# Patient Record
Sex: Female | Born: 1960 | Race: White | Hispanic: No | Marital: Married | Smoking: Never smoker
Health system: Southern US, Community
[De-identification: ages and names within clinical notes are randomized; demographics above are authoritative.]

## PROBLEM LIST (undated history)

## (undated) DIAGNOSIS — N200 Calculus of kidney: Secondary | ICD-10-CM

## (undated) DIAGNOSIS — M7710 Lateral epicondylitis, unspecified elbow: Secondary | ICD-10-CM

## (undated) DIAGNOSIS — N83209 Unspecified ovarian cyst, unspecified side: Secondary | ICD-10-CM

## (undated) HISTORY — DX: Calculus of kidney: N20.0

## (undated) HISTORY — DX: Unspecified ovarian cyst, unspecified side: N83.209

## (undated) HISTORY — DX: Lateral epicondylitis, unspecified elbow: M77.10

## (undated) HISTORY — PX: ROTATOR CUFF REPAIR: SHX139

## (undated) HISTORY — PX: TONSILLECTOMY AND ADENOIDECTOMY: SHX28

---

## 2010-01-25 ENCOUNTER — Encounter: Payer: Self-pay | Admitting: Family Medicine

## 2010-04-17 ENCOUNTER — Ambulatory Visit: Payer: Self-pay | Admitting: Family Medicine

## 2010-04-17 ENCOUNTER — Encounter: Payer: Self-pay | Admitting: Family Medicine

## 2010-04-17 DIAGNOSIS — R634 Abnormal weight loss: Secondary | ICD-10-CM | POA: Insufficient documentation

## 2010-04-17 DIAGNOSIS — R5383 Other fatigue: Secondary | ICD-10-CM

## 2010-04-17 DIAGNOSIS — R5381 Other malaise: Secondary | ICD-10-CM | POA: Insufficient documentation

## 2010-04-19 LAB — CONVERTED CEMR LAB
ALT: 10 units/L (ref 0–35)
AST: 14 units/L (ref 0–37)
Alkaline Phosphatase: 63 units/L (ref 39–117)
BUN: 10 mg/dL (ref 6–23)
Basophils Absolute: 0.1 10*3/uL (ref 0.0–0.1)
Basophils Relative: 1 % (ref 0–1)
Calcium: 9.3 mg/dL (ref 8.4–10.5)
Chloride: 105 meq/L (ref 96–112)
Creatinine, Ser: 0.68 mg/dL (ref 0.40–1.20)
Eosinophils Relative: 1 % (ref 0–5)
Folate: 19.5 ng/mL
HCT: 35.1 % — ABNORMAL LOW (ref 36.0–46.0)
Iron: 58 ug/dL (ref 42–145)
Lymphocytes Relative: 26 % (ref 12–46)
MCHC: 31.1 g/dL (ref 30.0–36.0)
Monocytes Absolute: 0.6 10*3/uL (ref 0.1–1.0)
Platelets: 375 10*3/uL (ref 150–400)
RDW: 15.9 % — ABNORMAL HIGH (ref 11.5–15.5)
Saturation Ratios: 14 % — ABNORMAL LOW (ref 20–55)
Sed Rate: 1 mm/hr (ref 0–22)
TIBC: 405 ug/dL (ref 250–470)
Total Bilirubin: 0.5 mg/dL (ref 0.3–1.2)
Vit D, 25-Hydroxy: 29 ng/mL — ABNORMAL LOW (ref 30–89)

## 2010-05-21 ENCOUNTER — Encounter: Payer: Self-pay | Admitting: Family Medicine

## 2010-05-21 LAB — HM COLONOSCOPY

## 2010-05-23 ENCOUNTER — Telehealth: Payer: Self-pay | Admitting: Family Medicine

## 2010-05-27 ENCOUNTER — Encounter: Payer: Self-pay | Admitting: Family Medicine

## 2010-05-27 ENCOUNTER — Ambulatory Visit
Admission: RE | Admit: 2010-05-27 | Discharge: 2010-05-27 | Payer: Self-pay | Source: Home / Self Care | Attending: Family Medicine | Admitting: Family Medicine

## 2010-05-27 DIAGNOSIS — D510 Vitamin B12 deficiency anemia due to intrinsic factor deficiency: Secondary | ICD-10-CM | POA: Insufficient documentation

## 2010-05-27 DIAGNOSIS — D509 Iron deficiency anemia, unspecified: Secondary | ICD-10-CM | POA: Insufficient documentation

## 2010-05-27 DIAGNOSIS — E559 Vitamin D deficiency, unspecified: Secondary | ICD-10-CM | POA: Insufficient documentation

## 2010-05-30 NOTE — Progress Notes (Signed)
Summary: Patient with a question   Phone Note Call from Patient   Caller: Patient Call For: Azriel Dancy Summary of Call: Patient called and she seen Dr. Linford Arnold for the first time in December and is not sure when she needs to f/u with Dr. Linford Arnold again. She would like a phone call back to let her know. Please advise. Thanks, Michaelle Copas  May 23, 2010 9:31 AM  Initial call taken by: Michaelle Copas,  May 23, 2010 9:31 AM  Follow-up for Phone Call        [facility] up at the end of this month.  Follow-up by: Nani Gasser MD,  May 23, 2010 9:40 AM  Additional Follow-up for Phone Call Additional follow up Details #1::        I called patient and scheduled her for 05-27-10 at 2:30.Michaelle Copas  May 24, 2010 9:02 AM  Additional Follow-up by: Michaelle Copas,  May 24, 2010 9:02 AM

## 2010-05-30 NOTE — Assessment & Plan Note (Signed)
Summary: SEG:BTDVVO Loss, Fatigue   Vital Signs:  Patient profile:   50 year old female Height:      66.25 inches Weight:      119 pounds BMI:     19.13 Pulse rate:   102 / minute BP sitting:   111 / 68  (right arm) Cuff size:   regular  Vitals Entered By: Avon Gully CMA, Duncan Dull) (April 17, 2010 9:27 AM) CC: NP est care-fatigue and wt loss   CC:  NP est care-fatigue and wt loss.  History of Present Illness: NP est care-fatigue and wt loss.  Since October has lost 10 lbs. Hasn't been trying too. Has actually slacked of ont he exercise becaue thought it was causing some of the weight loss.  NOrmally weighs in the low 120s.  Has really heavy periods and seeing Gynel[facility] _- Dr. Ruby Cola at [facility]. No nervous, sweaty,anxious. Occ notices heart flutter in the past month.  Can last minute or two. No CP.  2 cousins have thyroid issues. No hair or skin changes. Nails are more brittle.Eats 3 meals a day. No HA, no nubmness or tingling. Her mammmo is uptodate.  Her pap is up to date. Never had a colonoscopy.  Lots more fatigued.   Habits & Providers  Alcohol-Tobacco-Diet     Alcohol drinks/day: <1     Tobacco Status: never  Exercise-Depression-Behavior     Does Patient Exercise: no     STD Risk: never     Drug Use: no     Seat Belt Use: always  Current Medications (verified): 1)  None  Allergies (verified): No Known Drug Allergies  Comments:  Nurse/Medical Assistant: The patient's medications and allergies were reviewed with the patient and were updated in the Medication and Allergy Lists. 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  Married to [employer] with 2 adult kids.  Never  Smoked Alcohol use-yes Drug use-no Regular exercise-no 1 caffeinated drink per day.  Smoking Status:  never Does Patient Exercise:  no STD Risk:  never Drug Use:  no Seat Belt Use:  always  Review of Systems       No fever/sweats/weakness, + unexplained weight loss/gain.  No vison changes.  No difficulty hearing/ringing in ears, hay fever/allergies.  No chest pain/discomfort, + palpitations.  No Br lump/nipple discharge.  No cough/wheeze.  No blood in BM, nausea/vomiting/diarrhea.  No nighttime urination, leaking urine, unusual vaginal bleeding, discharge (penis or vagina).  No muscle/joint pain. No rash, change in mole.  No HA, memory loss.  No anxiety, sleep d/o, depression.  No easy bruising/bleeding, unexplained lump   Physical Exam  General:  Well-developed,well-nourished,in no acute distress; alert,appropriate and cooperative throughout examination Head:  Normocephalic and atraumatic without obvious abnormalities. No apparent alopecia or balding. Eyes:  No corneal or conjunctival inflammation noted. EOMI. Perrla.  Ears:  External ear exam shows no significant lesions or deformities.  Otoscopic examination reveals clear canals, tympanic membranes are intact bilaterally without bulging, retraction, inflammation or discharge. Hearing is grossly normal bilaterally. Nose:  External nasal examination shows no deformity or inflammation.  Mouth:  Oral mucosa and oropharynx without lesions or exudates.  Teeth in good repair. Neck:  No deformities, masses, or tenderness noted. Lungs:  Normal  respiratory effort, chest expands symmetrically. Lungs are clear to auscultation, no crackles or wheezes. Heart:  Normal rate and regular rhythm. S1 and S2 normal without gallop, murmur, click, rub or other extra sounds. Abdomen:  Bowel sounds positive,abdomen soft and non-tender without masses, organomegaly or hernias noted. Msk:  No deformity or scoliosis noted of thoracic or lumbar spine.   Pulses:   RAdial and dorsla pedal 2+ bilat  Extremities:  No LE edema.  Neurologic:  alert & oriented X3, cranial nerves II-XII intact, gait normal, and DTRs symmetrical and normal.   Skin:  Intact without suspicious lesions or rashes Cervical Nodes:  No lymphadenopathy noted Psych:  Cognition and judgment appear intact. Alert and cooperative with normal attention span and concentration. No apparent delusions, illusions, hallucinations   Impression & Recommendations:  Problem # 1:  WEIGHT LOSS (ICD-783.21) Discussed could be hyperthyroid. May be physiologic and my not really have a cause. Will rule out elevation in liver or kidney funciton.  Also her pap and mammo are up tod ate. Need to get a colonoscpy . Rec age appropirate cancer screening. Will also check a CBC.  Orders: T-Comprehensive Metabolic Panel [phone]) T-TSH [phone]) T-CBC w/Diff [phone]) T-Vitamin D (25-Hydroxy) [phone]) T-Vitamin B12 [phone]) T-Sed Rate (Automated) [phone]) T-Iron [phone]) T-Iron Binding Capacity (TIBC) ([id number]) T-Folate (95188)  Problem # 2:  FATIGUE (ICD-780.79) Discussed may be anemia, which could also be causing th palpitations. Will rule out thyroidd/o as well. Also check for deficiencies .   Orders: T-Comprehensive Metabolic Panel [phone]) T-TSH [phone]) T-CBC w/Diff [phone]) T-Vitamin D (25-Hydroxy) [phone]) T-Vitamin B12 [phone]) T-Sed Rate (Automated) [phone])  Other Orders: Gastroenterology Referral (GI)  Patient Instructions: 1)  We will call you with your lab results  2)  We will also refer you for a colonoscopy as it is recommend to screen for colon cancer at age 6.    Orders Added: 1)  T-Comprehensive Metabolic Panel [[id number]] 2)  T-TSH [[id number]] 3)  T-CBC w/Diff [[id number]] 4)  T-Vitamin D (25-Hydroxy) [67893-81017] 5)  T-Vitamin B12 [82607-23330] 6)  T-Sed Rate (Automated) [51025-85277] 7)  T-Iron  [[id number]] 8)  T-Iron Binding Capacity (TIBC) [[address]] 9)  T-Folate [23340] 10)  Gastroenterology Referral [GI] 11)  New Patient Level IV [76195]

## 2010-06-05 NOTE — Miscellaneous (Signed)
Summary: Colonosocpy  Clinical Lists Changes  Observations: Added new observation of HEMOCULTDUE: Not Indicated (05/27/2010 9:02) Added new observation of FLEXSIGDUE: Not Indicated (05/27/2010 9:02) Added new observation of COLONNXTDUE: 05/21/2020 (05/27/2010 9:02) Added new observation of CREATNXTDUE: 04/18/2011 (05/27/2010 9:02) Added new observation of POTASSIUMDUE: 04/18/2011 (05/27/2010 9:02) Added new observation of LST COLON DT: 05/21/2010 (05/21/2010 9:03) Added new observation of COLONOSCOPY: normal (05/21/2010 9:03)     Flex Sig Next Due:  Not Indicated Colonoscopy Result Date:  05/21/2010 Colonoscopy Result:  normal Colonoscopy Next Due:  10 yr Hemoccult Next Due:  Not Indicated Performed t [facility].

## 2010-06-05 NOTE — Assessment & Plan Note (Signed)
Summary: F/U fatigue   Vital Signs:  Patient profile:   50 year old female Height:      66.25 inches Weight:      120 pounds Pulse rate:   83 / minute BP sitting:   97 / 59  (right arm) Cuff size:   regular  Vitals Entered By: Avon Gully CMA, Duncan Dull) (May 27, 2010 2:45 PM) CC: f/u low iron   CC:  f/u low iron.  History of Present Illness: Completed the vitaming D adn now taking the 2000iu once a day.  Sasys she dos feel some better. She is also alot less stressed than last time. Her daughter is off sutdying abroad adn the HOlidays are over.She has not had any more palpititaions. She has also stopped having periods.   Not on the iron or the Vitaming B 12 but has questions. She doesn't want to do injections. She is not takinbg either one of these supplements.    Current Medications (verified): 1)  Vitamin D 2000iu Capsules .Marland KitchenMarland Kitchen. 1 Capsule By Mouth Once A Wk.  Allergies (verified): No Known Drug Allergies  Comments:  Nurse/Medical Assistant: The patient's medications and allergies were reviewed with the patient and were updated in the Medication and Allergy Lists. Avon Gully CMA, Duncan Dull) (May 27, 2010 2:47 PM)  Physical Exam  General:  Well-developed,well-nourished,in no acute distress; alert,appropriate and cooperative throughout examination Lungs:  Normal respiratory effort, chest expands symmetrically. Lungs are clear to auscultation, no crackles or wheezes. Heart:  Normal rate and regular rhythm. S1 and S2 normal without gallop, murmur, click, rub or other extra sounds.   Impression & Recommendations:  Problem # 1:  ANEMIA, IRON DEFICIENCY (ICD-280.9) Starte OTC iron and f/u in 3 months. Recommend dietary sources of iron such as liver.  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 If not responding then may nto be absorbing it throught her gut.   Orders: T-Vitamin B12 ([id number])  Problem # 3:  VITAMIN D DEFICIENCY (ICD-268.9) Will recheck herlevels at her f/u in 3 months.  Orders: T-Vitamin B12 ([id number])  Problem # 4:  FATIGUE (ICD-780.79) Explained to her this should get a lot better on teh iron and B12 but I also think her improved pyscho-social facgtors have helped this as well.   Complete Medication List: 1)  Vitamin D 2000iu Capsules  .Marland Kitchen.. 1 capsule by mouth once a wk.  Other Orders: T-Vitamin D (25-Hydroxy) [phone])  Patient Instructions: 1)  Will recheck your Vitamin B12 and iron levels.  2)  Start one a day B12 and iron.    Orders Added: 1)  T-Iron [[id number]] 2)  T-Iron Binding Capacity (TIBC) [[address]] 3)  T-Vitamin D (25-Hydroxy) [60109-32355] 4)  T-Vitamin B12 [82607-23330] 5)  Est. Patient Level III [73220]

## 2010-06-13 NOTE — Procedures (Signed)
Summary: Colonoscopy Report/[facility]  Colonoscopy Report/[facility]   Imported By: Maryln Gottron 06/06/2010 14:16:22  _____________________________________________________________________  External Attachment:    Type:   Image     Comment:   External Document

## 2010-06-13 NOTE — Letter (Signed)
Summary: Records from [facility] 2010 - 2011  Records from [facility] 2010 - 2011   Imported By: Maryln Gottron 06/05/2010 12:19:16  _____________________________________________________________________  External Attachment:    Type:   Image     Comment:   External Document

## 2010-09-05 ENCOUNTER — Ambulatory Visit (INDEPENDENT_AMBULATORY_CARE_PROVIDER_SITE_OTHER): Payer: Commercial Indemnity | Admitting: Family Medicine

## 2010-09-05 DIAGNOSIS — J329 Chronic sinusitis, unspecified: Secondary | ICD-10-CM

## 2010-09-05 DIAGNOSIS — J40 Bronchitis, not specified as acute or chronic: Secondary | ICD-10-CM

## 2010-09-05 MED ORDER — AMOXICILLIN 875 MG PO TABS: 875.0000 mg | ORAL_TABLET | Freq: Two times a day (BID) | ORAL | Status: AC

## 2010-09-05 NOTE — Progress Notes (Signed)
  Subjective:    Patient ID: Laurie Cooper, female    DOB: Jan 21, 1961, 50 y.o.   MRN: [id number]  HPI Cough, fever for 2 days.  Husband with with sxs for 2 weeks.  Fever was 102 last night. No SOB. Cough is more dry today. No N/V/D.  No CP or SOB. No meds for sx.  Feels fatigued, achey.  Ears feel full, no ST. Teeth are aching. No seasonal allergies. Cough is not keeping her awake at night.  No worsening or alleviating sxs.    Review of Systems     Objective:   Physical Exam  Constitutional: She is oriented to person, place, and time. She appears well-developed and well-nourished.  HENT:  Head: Normocephalic and atraumatic.  Right Ear: External ear normal.  Nose: Nose normal.  Mouth/Throat: Oropharynx is clear and moist.  Eyes: Conjunctivae are normal. Pupils are equal, round, and reactive to light.  Neck: Neck supple. No thyromegaly present.  Cardiovascular: Normal rate, regular rhythm and normal heart sounds.   Pulmonary/Chest: Effort normal and breath sounds normal.  Lymphadenopathy:    She has no cervical adenopathy.  Neurological: She is alert and oriented to person, place, and time.  Skin: Skin is warm and dry.          Assessment & Plan:  Likely viral sinusitis/bronchtis - Likely viral. If fever higher than 102 to if fever persistant for more than 4 days then can fill the rx for the ABX. Symptomatic care.  Cal if not better in 14 days.

## 2010-09-06 ENCOUNTER — Ambulatory Visit: Payer: Self-pay | Admitting: Family Medicine

## 2010-09-17 ENCOUNTER — Telehealth: Payer: Self-pay | Admitting: Family Medicine

## 2010-09-26 NOTE — Telephone Encounter (Signed)
Closed. Alla Sloma, LPN /Triage  

## 2010-11-11 ENCOUNTER — Ambulatory Visit (INDEPENDENT_AMBULATORY_CARE_PROVIDER_SITE_OTHER): Payer: Commercial Indemnity | Admitting: Family Medicine

## 2010-11-11 DIAGNOSIS — Z23 Encounter for immunization: Secondary | ICD-10-CM

## 2010-11-11 MED ORDER — TETANUS-DIPHTH-ACELL PERTUSSIS 5-2-15.5 LF-MCG/0.5 IM SUSP: 0.5000 mL | Freq: Once | INTRAMUSCULAR | Status: DC

## 2010-11-11 NOTE — Patient Instructions (Signed)
Pt  Instructed that redness, swelling, or hardness at injection site is common.  Cold compress first 24 hours and then go to heat.  Tylenol Q 4-6 hours may be used as needed for discomfort.  Updated VIS sheet given.  Pt never had problems with tetanus vaccine in the past.  Well today without any acute symptoms or complaints. Jarvis Newcomer, LPN [facility]

## 2010-11-11 NOTE — Progress Notes (Signed)
  Subjective:    Patient ID: Laurie Cooper, female    DOB: 03-Jul-1960, 50 y.o.   MRN: [id number]  HPI  Tdap given.   Review of Systems     Objective:   Physical Exam        Assessment & Plan:

## 2012-03-16 ENCOUNTER — Ambulatory Visit (INDEPENDENT_AMBULATORY_CARE_PROVIDER_SITE_OTHER): Payer: Commercial Indemnity | Admitting: Family Medicine

## 2012-03-16 ENCOUNTER — Encounter: Payer: Self-pay | Admitting: Family Medicine

## 2012-03-16 VITALS — BP 108/67 | HR 70 | Temp 98.4°F | Resp 14 | Wt 130.0 lb

## 2012-03-16 DIAGNOSIS — R682 Dry mouth, unspecified: Secondary | ICD-10-CM

## 2012-03-16 DIAGNOSIS — H04129 Dry eye syndrome of unspecified lacrimal gland: Secondary | ICD-10-CM

## 2012-03-16 DIAGNOSIS — R1032 Left lower quadrant pain: Secondary | ICD-10-CM

## 2012-03-16 DIAGNOSIS — R1031 Right lower quadrant pain: Secondary | ICD-10-CM

## 2012-03-16 DIAGNOSIS — H04123 Dry eye syndrome of bilateral lacrimal glands: Secondary | ICD-10-CM

## 2012-03-16 DIAGNOSIS — K117 Disturbances of salivary secretion: Secondary | ICD-10-CM

## 2012-03-16 DIAGNOSIS — R300 Dysuria: Secondary | ICD-10-CM

## 2012-03-16 LAB — POCT URINALYSIS DIPSTICK
Bilirubin, UA: NEGATIVE
Glucose, UA: NEGATIVE
Leukocytes, UA: NEGATIVE
Nitrite, UA: NEGATIVE
Urobilinogen, UA: 0.2

## 2012-03-16 NOTE — Progress Notes (Signed)
CC: Laurie Cooper is a 51 y.o. female is here for Pelvic Pain and Back Pain   Subjective: HPI:  Patient complains of low pelvic pain is been present ever since Sunday. Getting worse on a daily basis. Present 24 hours a day and has even awoken her at night at times. Pain is described as a burning discomfort. Is not influenced by urinating nor bowel habits. Pain is not radiating. Pain is somewhat worse when driving on rough roads, improves with lying down flat. Nothing else makes better or worse. Pain is 5/10 at its worse on the severity scale. She has never had this discomfort before.  No interventions as of yet. She denies fevers, chills, nausea, vomiting, night sweats, abdominal pain, flank pain, diarrhea, constipation, urinary frequency, change in the odor color or consistency of her urine, nor abnormal vaginal discharge. Her periods are becoming more unpredictable over the past years, most recent period First a this month and lasted less than a day.  She continues to have regular bowel movements, denies constipation. She has a history of cysts on her ovaries but no history of ruptured cyst  Reports dry mouth the last 4 months seems to be more noticeable. Finds that she's always having to have water or liquids around. Has presented in conjunction with dry eyes and inability to wear contacts for more than 4 hours do to eye irritation. Denies family history of rheumatologic disease. Denies history of ulcerations in the mouth or on the eyes.    Review Of Systems Outlined In HPI  Past Medical History  Diagnosis Date  . Kidney stone      Family History  Problem Relation Age of Onset  . Hodgkin's lymphoma Mother   . Diabetes Maternal Grandmother   . Diabetes Paternal Grandmother      History  Substance Use Topics  . Smoking status: Never Smoker   . Smokeless tobacco: Not on file  . Alcohol Use: Not on file     Objective: Filed Vitals:   03/16/12 1553  BP: 108/67  Pulse: 70  Temp:  98.4 F (36.9 C)  Resp: 14    General: Alert and Oriented, No Acute Distress HEENT: Pupils equal, round, reactive to light. Conjunctivae clear.   Moist mucous membranes, pharynx without inflammation nor lesions.  Neck supple without palpable lymphadenopathy nor abnormal masses. Cardiac: Regular rate and rhythm.  Abdomen: Normal bowel sounds, no palpable masses, negative Murphy's sign, right and left lower quadrant discomfort with deep palpation without rebound. Pain reproduced is that which she presents for Extremities: No peripheral edema.  Strong peripheral pulses.  Mental Status: No depression, anxiety, nor agitation. Skin: Warm and dry.  Assessment & Plan: Laurie Cooper was seen today for pelvic pain and back pain.  Diagnoses and associated orders for this visit:  Dysuria - POCT urinalysis dipstick - Urine culture  Right lower quadrant pain - Sed Rate (ESR) - C-reactive protein - CBC with Differential  Left lower quadrant pain - Sed Rate (ESR) - C-reactive protein - CBC with Differential  Dry mouth - Antinuclear Antib (ANA)  Dry eyes - Antinuclear Antib (ANA)    Urinalysis not suggestive of UTI nor nephrolithiasis, will follow culture. Inflammatory labs above to rule out diverticulitis or active infection. If these are unremarkable we will consider pelvic ultrasound, CT scan otherwise. Rule out possibility of sogrens syndrome with ANA. I will call her with results tomorrow for further management she declines pain medication citing that her pain is not"that bad".Signs and symptoms requring  emergent/urgent reevaluation were discussed with the patient.  Return for will call you with results tomorrow.

## 2012-03-17 LAB — CBC WITH DIFFERENTIAL/PLATELET
Basophils Relative: 1 % (ref 0–1)
Eosinophils Absolute: 0.1 10*3/uL (ref 0.0–0.7)
Eosinophils Relative: 1 % (ref 0–5)
Lymphs Abs: 2.5 10*3/uL (ref 0.7–4.0)
MCH: 30.6 pg (ref 26.0–34.0)
MCHC: 33.9 g/dL (ref 30.0–36.0)
MCV: 90.2 fL (ref 78.0–100.0)
Monocytes Relative: 11 % (ref 3–12)
Neutrophils Relative %: 61 % (ref 43–77)
Platelets: 311 10*3/uL (ref 150–400)
RBC: 4.58 MIL/uL (ref 3.87–5.11)

## 2012-03-17 LAB — ANA: Anti Nuclear Antibody(ANA): NEGATIVE

## 2012-03-17 LAB — SEDIMENTATION RATE: Sed Rate: 1 mm/hr (ref 0–22)

## 2012-03-19 LAB — URINE CULTURE

## 2012-03-22 ENCOUNTER — Telehealth: Payer: Self-pay | Admitting: *Deleted

## 2012-03-22 DIAGNOSIS — R102 Pelvic and perineal pain: Secondary | ICD-10-CM

## 2012-03-22 NOTE — Telephone Encounter (Signed)
Pt called and states the abd pain has come back and would like to proceed with the u/s

## 2012-03-23 ENCOUNTER — Ambulatory Visit (HOSPITAL_BASED_OUTPATIENT_CLINIC_OR_DEPARTMENT_OTHER)
Admission: RE | Admit: 2012-03-23 | Discharge: 2012-03-23 | Disposition: A | Discharge status: Home / Self Care | Payer: Commercial Indemnity | Source: Ambulatory Visit | Attending: Family Medicine | Admitting: Family Medicine

## 2012-03-23 ENCOUNTER — Other Ambulatory Visit: Payer: Self-pay | Admitting: Family Medicine

## 2012-03-23 DIAGNOSIS — R9389 Abnormal findings on diagnostic imaging of other specified body structures: Secondary | ICD-10-CM | POA: Insufficient documentation

## 2012-03-23 DIAGNOSIS — R102 Pelvic and perineal pain: Secondary | ICD-10-CM

## 2012-03-23 DIAGNOSIS — N949 Unspecified condition associated with female genital organs and menstrual cycle: Secondary | ICD-10-CM | POA: Insufficient documentation

## 2012-03-23 NOTE — Telephone Encounter (Signed)
Pt.notified

## 2012-03-23 NOTE — Telephone Encounter (Signed)
Sue Lush, Order has been placed STAT, will you please let her know that I'm hopefull this will be scheduled before the holiday weekend at the [facility]. Thank you.

## 2012-03-26 ENCOUNTER — Encounter (HOSPITAL_BASED_OUTPATIENT_CLINIC_OR_DEPARTMENT_OTHER): Payer: Self-pay | Admitting: *Deleted

## 2012-03-26 ENCOUNTER — Emergency Department (HOSPITAL_BASED_OUTPATIENT_CLINIC_OR_DEPARTMENT_OTHER)
Admission: EM | Admit: 2012-03-26 | Discharge: 2012-03-26 | Disposition: A | Discharge status: Home / Self Care | Payer: Commercial Indemnity | Attending: Emergency Medicine | Admitting: Emergency Medicine

## 2012-03-26 DIAGNOSIS — Z8742 Personal history of other diseases of the female genital tract: Secondary | ICD-10-CM | POA: Insufficient documentation

## 2012-03-26 DIAGNOSIS — Z87442 Personal history of urinary calculi: Secondary | ICD-10-CM | POA: Insufficient documentation

## 2012-03-26 DIAGNOSIS — Z3202 Encounter for pregnancy test, result negative: Secondary | ICD-10-CM | POA: Insufficient documentation

## 2012-03-26 DIAGNOSIS — N898 Other specified noninflammatory disorders of vagina: Secondary | ICD-10-CM | POA: Insufficient documentation

## 2012-03-26 DIAGNOSIS — N939 Abnormal uterine and vaginal bleeding, unspecified: Secondary | ICD-10-CM

## 2012-03-26 LAB — URINE MICROSCOPIC-ADD ON

## 2012-03-26 LAB — CBC WITH DIFFERENTIAL/PLATELET
Basophils Relative: 1 % (ref 0–1)
Eosinophils Absolute: 0.1 10*3/uL (ref 0.0–0.7)
Eosinophils Relative: 1 % (ref 0–5)
HCT: 37.9 % (ref 36.0–46.0)
Hemoglobin: 13 g/dL (ref 12.0–15.0)
MCH: 30.7 pg (ref 26.0–34.0)
MCHC: 34.3 g/dL (ref 30.0–36.0)
MCV: 89.4 fL (ref 78.0–100.0)
Monocytes Absolute: 1 10*3/uL (ref 0.1–1.0)
Monocytes Relative: 13 % — ABNORMAL HIGH (ref 3–12)

## 2012-03-26 LAB — URINALYSIS, ROUTINE W REFLEX MICROSCOPIC
Bilirubin Urine: NEGATIVE
Nitrite: NEGATIVE
Protein, ur: NEGATIVE mg/dL
Urobilinogen, UA: 0.2 mg/dL (ref 0.0–1.0)

## 2012-03-26 LAB — BASIC METABOLIC PANEL
BUN: 8 mg/dL (ref 6–23)
Calcium: 8.6 mg/dL (ref 8.4–10.5)
Chloride: 104 mEq/L (ref 96–112)
Creatinine, Ser: 0.6 mg/dL (ref 0.50–1.10)
GFR calc Af Amer: >90 mL/min (ref >90)
GFR calc non Af Amer: >90 mL/min (ref >90)

## 2012-03-26 LAB — WET PREP, GENITAL

## 2012-03-26 NOTE — ED Notes (Signed)
Patient states she has been having vaginal pain for several weeks,  Was worked up by her PCP and had an out pt ultrasound which showed a vaginal polyp.  States she developed vaginal bleeding yesterday, now is bleeding heavy requiring her to change her tampon and pad every hour since 0600 today.  C/o abdominal heavy abdominal cramping.

## 2012-03-26 NOTE — ED Provider Notes (Signed)
History     CSN: [id number]  Arrival date & time 03/26/12  4696   First MD Initiated Contact with Patient 03/26/12 [phone]      Chief Complaint  Patient presents with  . Vaginal Bleeding    (Consider location/radiation/quality/duration/timing/severity/associated sxs/prior treatment) HPI Pt presenting with c/o vaginal bleeding.  She states that several days ago she had pelvic ultrasound due to some lower abdominal pain which had been ongoing for the past several weeks.  An endometrial polyp was found.  Last night she began to have vaginal bleeding. Today she has changed approx 1 tampon per hour. No passage of clots.  No vomiting, no signifcant pain today.  No fever/chills.  No lightheadedness or fainting.  There are no other associated systemic symptoms, there are no other alleviating or modifying factors.   Past Medical History  Diagnosis Date  . Kidney stone   . Vaginal polyp     Past Surgical History  Procedure Date  . Rotator cuff repair     left  . Tonsillectomy and adenoidectomy     Family History  Problem Relation Age of Onset  . Hodgkin's lymphoma Mother   . Diabetes Maternal Grandmother   . Diabetes Paternal Grandmother     History  Substance Use Topics  . Smoking status: Never Smoker   . Smokeless tobacco: Not on file  . Alcohol Use: Yes     Comment: rarely    OB History    Grav Para Term Preterm Abortions TAB SAB Ect Mult Living                  Review of Systems ROS reviewed and all otherwise negative except for mentioned in HPI  Allergies  Review of patient's allergies indicates no known allergies.  Home Medications   Current Outpatient Rx  Name  Route  Sig  Dispense  Refill  . ONE-DAILY MULTI VITAMINS PO TABS   Oral   Take 1 tablet by mouth daily.             BP 103/56  Pulse 69  Temp 98.3 F (36.8 C) (Oral)  Resp 20  Ht 5\' 6"  (1.676 m)  Wt 120 lb (54.432 kg)  BMI 19.37 kg/m2  SpO2 99%  LMP 03/25/2012 Vitals reviewed Physical  Exam Physical Examination: General appearance - alert, well appearing, and in no distress Mental status - alert, oriented to person, place, and time Eyes - pupils equal and reactive, extraocular eye movements intact Mouth - mucous membranes moist, pharynx normal without lesions Chest - clear to auscultation, no wheezes, rales or rhonchi, symmetric air entry Heart - normal rate, regular rhythm, normal S1, S2, no murmurs, rubs, clicks or gallops Abdomen - soft, nontender, nondistended, no masses or organomegaly Pelvic - normal external genitalia, vulva, vagina, cervix, uterus and adnexa, moderate amount of blood in vaginal vault, no clots, no CMT, no adnexal tenderness Extremities - peripheral pulses normal, no pedal edema, no clubbing or cyanosis Skin - normal coloration and turgor, no rashes  ED Course  Procedures (including critical care time)  Labs Reviewed  URINALYSIS, ROUTINE W REFLEX MICROSCOPIC - Abnormal; Notable for the following:    Hgb urine dipstick LARGE (*)     All other components within normal limits  CBC WITH DIFFERENTIAL - Abnormal; Notable for the following:    Monocytes Relative 13 (*)     All other components within normal limits  BASIC METABOLIC PANEL - Abnormal; Notable for the following:    Glucose,  Bld 117 (*)     All other components within normal limits  URINE MICROSCOPIC-ADD ON - Abnormal; Notable for the following:    Bacteria, UA FEW (*)     All other components within normal limits  PREGNANCY, URINE  WET PREP, GENITAL  GC/CHLAMYDIA PROBE AMP   No results found.   1. Vaginal bleeding       MDM  Pt presenting with c/o vaginal bleeding.  Not anemic and other labs reassuring.  On pelvic exam she has moderate amount of vaginal blood, otherwise exam is normal.  Pt's last menses was 8/1- she has not yet gone through menopause, but this may be perimenopausal bleeding.  She did have ultrasound last week which showed approx 1cm endometrial polyp and thick  endometrial stripe.  I have advised her of the importance of f/u with gynecology and symptoms that warrant re-eval.  Discharged with strict return precautions.  Pt agreeable with plan.        Ethelda Chick, MD 03/26/12 1249

## 2012-04-01 ENCOUNTER — Encounter: Payer: Self-pay | Admitting: Obstetrics & Gynecology

## 2012-04-01 ENCOUNTER — Ambulatory Visit (INDEPENDENT_AMBULATORY_CARE_PROVIDER_SITE_OTHER): Payer: Commercial Indemnity | Admitting: Obstetrics & Gynecology

## 2012-04-01 VITALS — BP 132/75 | HR 74 | Temp 98.5°F | Resp 16 | Ht 65.0 in | Wt 127.0 lb

## 2012-04-01 DIAGNOSIS — N938 Other specified abnormal uterine and vaginal bleeding: Secondary | ICD-10-CM | POA: Insufficient documentation

## 2012-04-01 DIAGNOSIS — N949 Unspecified condition associated with female genital organs and menstrual cycle: Secondary | ICD-10-CM

## 2012-04-01 DIAGNOSIS — N841 Polyp of cervix uteri: Secondary | ICD-10-CM

## 2012-04-01 DIAGNOSIS — N920 Excessive and frequent menstruation with regular cycle: Secondary | ICD-10-CM

## 2012-04-01 LAB — CBC
MCV: 90.1 fL (ref 78.0–100.0)
Platelets: 326 10*3/uL (ref 150–400)
RDW: 13.3 % (ref 11.5–15.5)
WBC: 8.6 10*3/uL (ref 4.0–10.5)

## 2012-04-01 MED ORDER — MEGESTROL ACETATE 40 MG PO TABS: 40.0000 mg | ORAL_TABLET | Freq: Every day | ORAL | Status: DC

## 2012-04-01 NOTE — Progress Notes (Signed)
  Subjective:    Patient ID: Laurie Cooper, female    DOB: 1960/09/16, 51 y.o.   MRN: [id number]  HPI Pt has ben having menstural irregularities for 2 yrs.  She was bleeding for 2 weeks every 2 weeks for one year.  Then she started skipping periods and bleeding 2 weeks every 3 months.  On November 26th, she started bleeding heavy and hanged 30 tampons in 48 hours.  Pt having scant bleeding now.  US showed 23 mm lining with possible endometrial polyp.    Past Medical History  Diagnosis Date  . Kidney stone   . Ovarian cyst     Past Surgical History  Procedure Date  . Rotator cuff repair     left  . Tonsillectomy and adenoidectomy     Family History  Problem Relation Age of Onset  . Hodgkin's lymphoma Mother   . Diabetes Maternal Grandmother   . Diabetes Paternal Grandmother     Review of Systems  Constitutional: Negative.   Respiratory: Negative.   Cardiovascular: Negative.   Gastrointestinal: Negative.   Genitourinary: Positive for vaginal bleeding and menstrual problem.       Objective:   Physical Exam  Vitals reviewed. Constitutional: She is oriented to person, place, and time. She appears well-developed and well-nourished. No distress.  HENT:  Head: Normocephalic and atraumatic.  Eyes: Conjunctivae normal are normal.  Pulmonary/Chest: Effort normal.  Abdominal: Soft. She exhibits no distension. There is no tenderness. There is no guarding.  Genitourinary:       Labia-nml, vagina-pale pink, uterus-anteverted and mobile, ovaries-small and NT bilaterally, bladder-NT, cervix-1 x 1 cm fleshy red mass at 9 o'clock on the cervix.  Musculoskeletal: She exhibits no edema.  Neurological: She is alert and oriented to person, place, and time.  Skin: Skin is warm and dry.  Psychiatric: She has a normal mood and affect.        Assessment & Plan:  51 year old female with dysfunctional uterine bleeding.  1-Endometrial biopsy (separate procedure) 2-Cervical biopsy  (separate procedure) 3-Megace 40 mg daily. 4-RTC 2 weeks for results and plan.  ENDOMETRIAL BIOPSY     The indications for endometrial biopsy were reviewed.   Risks of the biopsy including cramping, bleeding, infection, uterine perforation, inadequate specimen and need for additional procedures  were discussed. The patient states she understands and agrees to undergo procedure today. Consent was signed. Time out was performed. Urine HCG was negative. A sterile speculum was placed in the patient's vagina and the cervix was prepped with Betadine. A single-toothed tenaculum was placed on the anterior lip of the cervix to stabilize it. The 3 mm pipelle was introduced into the endometrial cavity without difficulty to a depth of 8 cm, and a copious amount of blood and tissue was obtained and sent to pathology. Four passes were made with the pipelle.The instruments were removed from the patient's vagina. Minimal bleeding from the cervix was noted. The patient tolerated the procedure well. Routine post-procedure instructions were given to the patient. The patient will follow up to review the results and for further management.    Cervical biopsy During exam for endometrial biopsy, a 11 cm fleshy lesion ws noted on the cervix at 9 o'clock.  Permission was given to proceed with biopsy of cervix.  A Kvorkian was used to obtain a small piece of tissue at 9 o'clock.  Hemostasis was achieved with monsels and silver nitrate.

## 2012-04-01 NOTE — Patient Instructions (Signed)
Endometrial Biopsy This is a test in which a tissue sample (a biopsy) is taken from inside the uterus (womb). It is then looked at by a specialist under a microscope to see if the tissue is normal or abnormal. The endometrium is the lining of the uterus. This test helps determine where you are in your menstrual cycle and how hormone levels are affecting the lining of the uterus. Another use for this test is to diagnose endometrial cancer, tuberculosis, polyps, or inflammatory conditions and to evaluate uterine bleeding. PREPARATION FOR TEST No preparation or fasting is necessary. NORMAL FINDINGS No pathologic conditions. Presence of "secretory-type" endometrium 3 to 5 days before to normal menstruation. Ranges for normal findings may vary among different laboratories and hospitals. You should always check with your doctor after having lab work or other tests done to discuss the meaning of your test results and whether your values are considered within normal limits. MEANING OF TEST  Your caregiver will go over the test results with you and discuss the importance and meaning of your results, as well as treatment options and the need for additional tests if necessary. OBTAINING THE TEST RESULTS It is your responsibility to obtain your test results. Ask the lab or department performing the test when and how you will get your results. Document Released: 08/15/2004 Document Revised: 07/07/2011 Document Reviewed: 03/24/2008 [facility] Patient Information 2013 [place], [place]. Endometrial Ablation Endometrial ablation removes the lining of the uterus (endometrium). It is usually a same day, outpatient treatment. Ablation helps avoid major surgery (such as a hysterectomy). A hysterectomy is removal of the cervix and uterus. Endometrial ablation has less risk and complications, has a shorter recovery period and is less expensive. After endometrial ablation, most women will have little or no menstrual bleeding.  You may not keep your fertility. Pregnancy is no longer likely after this procedure but if you are pre-menopausal, you still need to use a reliable method of birth control following the procedure because pregnancy can occur. REASONS TO HAVE THE PROCEDURE MAY INCLUDE:  Heavy periods.  Bleeding that is causing anemia.  Anovulatory bleeding, very irregular, bleeding.  Bleeding submucous fibroids (on the lining inside the uterus) if they are smaller than 3 centimeters. REASONS NOT TO HAVE THE PROCEDURE MAY INCLUDE:  You wish to have more children.  You have a pre-cancerous or cancerous problem. The cause of any abnormal bleeding must be diagnosed before having the procedure.  You have pain coming from the uterus.  You have a submucus fibroid larger than 3 centimeters.  You recently had a baby.  You recently had an infection in the uterus.  You have a severe retro-flexed, tipped uterus and cannot insert the instrument to do the ablation.  You had a Cesarean section or deep major surgery on the uterus.  The inner cavity of the uterus is too large for the endometrial ablation instrument. RISKS AND COMPLICATIONS   Perforation of the uterus.  Bleeding.  Infection of the uterus, bladder or vagina.  Injury to surrounding organs.  Cutting the cervix.  An air bubble to the lung (air embolus).  Pregnancy following the procedure.  Failure of the procedure to help the problem requiring hysterectomy.  Decreased ability to diagnose cancer in the lining of the uterus. BEFORE THE PROCEDURE  The lining of the uterus must be tested to make sure there is no pre-cancerous or cancer cells present.  Medications may be given to make the lining of the uterus thinner.  Ultrasound may be  used to evaluate the size and look for abnormalities of the uterus.  Future pregnancy is not desired. PROCEDURE  There are different ways to destroy the lining of the uterus.   Resectoscope - radio  frequency-alternating electric current is the most common one used.  Cryotherapy - freezing the lining of the uterus.  Heated Free Liquid - heated salt (saline) solution inserted into the uterus.  Microwave - uses high energy microwaves in the uterus.  Thermal Balloon - a catheter with a balloon tip is inserted into the uterus and filled with heated fluid. Your caregiver will talk with you about the method used in this clinic. They will also instruct you on the pros and cons of the procedure. Endometrial ablation is performed along with a procedure called operative hysteroscopy. A narrow viewing tube is inserted through the birth canal (vagina) and through the cervix into the uterus. A tiny camera attached to the viewing tube (hysteroscope) allows the uterine cavity to be shown on a TV monitor during surgery. Your uterus is filled with a harmless liquid to make the procedure easier. The lining of the uterus is then removed. The lining can also be removed with a resectoscope which allows your surgeon to cut away the lining of the uterus under direct vision. Usually, you will be able to go home within an hour after the procedure. HOME CARE INSTRUCTIONS   Do not drive for 24 hours.  No tampons, douching or intercourse for 2 weeks or until your caregiver approves.  Rest at home for 24 to 48 hours. You may then resume normal activities unless told differently by your caregiver.  Take your temperature two times a day for 4 days, and record it.  Take any medications your caregiver has ordered, as directed.  Use some form of contraception if you are pre-menopausal and do not want to get pregnant. Bleeding after the procedure is normal. It varies from light spotting and mildly watery to bloody discharge for 4 to 6 weeks. You may also have mild cramping. Only take over-the-counter or prescription medicines for pain, discomfort, or fever as directed by your caregiver. Do not use aspirin, as this may  aggravate bleeding. Frequent urination during the first 24 hours is normal. You will not know how effective your surgery is until at least 3 months after the surgery. SEEK IMMEDIATE MEDICAL CARE IF:   Bleeding is heavier than a normal menstrual cycle.  An oral temperature above 102 F (38.9 C) develops.  You have increasing cramps or pains not relieved with medication or develop belly (abdominal) pain which does not seem to be related to the same area of earlier cramping and pain.  You are light headed, weak or have fainting episodes.  You develop pain in the shoulder strap areas.  You have chest or leg pain.  You have abnormal vaginal discharge.  You have painful urination. Document Released: 02/22/2004 Document Revised: 07/07/2011 Document Reviewed: 05/22/2007 [facility] Patient Information 2013 [place], [place].

## 2012-04-07 ENCOUNTER — Encounter: Payer: Commercial Indemnity | Admitting: Obstetrics & Gynecology

## 2012-04-12 ENCOUNTER — Telehealth: Payer: Self-pay | Admitting: *Deleted

## 2012-04-12 NOTE — Telephone Encounter (Signed)
Pt notified that she does need a LEEP in the office.  She opts to come in and discuss with Dr Penne Lash before scheduling.

## 2012-04-14 ENCOUNTER — Encounter: Payer: Self-pay | Admitting: Obstetrics & Gynecology

## 2012-04-14 ENCOUNTER — Ambulatory Visit (INDEPENDENT_AMBULATORY_CARE_PROVIDER_SITE_OTHER): Payer: Commercial Indemnity | Admitting: Obstetrics & Gynecology

## 2012-04-14 VITALS — BP 121/74 | HR 83 | Temp 98.4°F | Resp 16 | Ht 65.0 in | Wt 127.0 lb

## 2012-04-14 DIAGNOSIS — R87619 Unspecified abnormal cytological findings in specimens from cervix uteri: Secondary | ICD-10-CM | POA: Insufficient documentation

## 2012-04-14 DIAGNOSIS — IMO0002 Reserved for concepts with insufficient information to code with codable children: Secondary | ICD-10-CM

## 2012-04-14 NOTE — Progress Notes (Signed)
Patient ID: Laurie Cooper, female   DOB: Mar 05, 1961, 51 y.o.   MRN: [id number]  Pt presents for results on endometrial biopsy and cervical biopsy.   Diagnosis 1. 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 - SEE MICROSCOPIC DESCRIPTION. - NO SQUAMOUS INTRAEPITHELIAL LESION. 2. Endometrium, biopsy - MIXED PHASE ENDOMETRIUM WITH EXTENSIVE BREAKDOWN AND DEGENERATIVE CHANGES. - NO HYPERPLASIA OR CARCINOMA. Microscopic Comment 1. The cervical biopsy has benign squamous epithelium and focally there is an endocervical gland with nuclei with enlargement and irregularity. The findings are atypical and the features are consistent with reactive endocervical glandular atypia. Multiple deeper levels are performed and the findings are similar. (JDP:eps 04/05/12) Jimmy Picket MD  There was a visual lesion on cervix (red, .5 cm at 3 o'clock).  There was no nodularity.  Given glandular atypia will proceed with larger biopsy with Fischer Cone Biopsy tool.    Pt is not bleeding on Megace.  Hgb was 13 on CBC.  Will wait on biopsy results of crvix before proceeding with ablation.  If there is concerning glandular pap smear abnormalities, will proceed with hysterectomy.  All questions answered.

## 2012-05-11 ENCOUNTER — Encounter: Payer: Commercial Indemnity | Admitting: Obstetrics & Gynecology

## 2012-05-27 ENCOUNTER — Encounter: Payer: Self-pay | Admitting: Obstetrics & Gynecology

## 2012-05-27 ENCOUNTER — Ambulatory Visit (INDEPENDENT_AMBULATORY_CARE_PROVIDER_SITE_OTHER): Payer: Commercial Indemnity | Admitting: Obstetrics & Gynecology

## 2012-05-27 VITALS — BP 122/74 | HR 68 | Temp 98.4°F | Resp 16 | Ht 65.0 in | Wt 128.0 lb

## 2012-05-27 DIAGNOSIS — Z01818 Encounter for other preprocedural examination: Secondary | ICD-10-CM

## 2012-05-27 DIAGNOSIS — R87619 Unspecified abnormal cytological findings in specimens from cervix uteri: Secondary | ICD-10-CM

## 2012-05-27 DIAGNOSIS — Z01812 Encounter for preprocedural laboratory examination: Secondary | ICD-10-CM

## 2012-05-27 DIAGNOSIS — IMO0002 Reserved for concepts with insufficient information to code with codable children: Secondary | ICD-10-CM

## 2012-05-27 DIAGNOSIS — N920 Excessive and frequent menstruation with regular cycle: Secondary | ICD-10-CM

## 2012-05-28 NOTE — Progress Notes (Signed)
LEEP PROCEDURE NOTE Pap smear and colposcopy reviewed.   Pap normal Colpo Biopsy atypical glandular cells, favor reactive Risks, benefits, alternatives, and limitations of procedure explained to patient, including pain, bleeding, infection, failure to remove abnormal tissue and failure to cure dysplasia, need for repeat procedures, damage to pelvic organs, cervical incompetence.  Role of HPV,cervical dysplasia and need for close followup was empasized. Informed written consent was obtained. All questions were answered. Time out performed.  ??Procedure: The patient was placed in lithotomy position and the bivalved coated speculum was placed in the patient's vagina. A grounding pad placed on the patient. Lugol's solution was applied to the cervix.   Local anesthesia was administered via an intracervical block using 10cc of 2% Lidocaine with epinephrine. The suction was turned on and the Medium 1X Fisher Cone Biopsy Excisor on 50 Watts of cutting current was used to excise the area of decreased uptake and excise the entire transformation zone. Excellent hemostasis was achieved using roller ball coagulation set at 50 Watts coagulation current. Monsel's solution was then applied and the speculum was removed from the vagina. Specimens were sent to pathology. ?The patient tolerated the procedure well. Post-operative instructions given to patient, including instruction to seek medical attention for persistent bright red bleeding, fever, abdominal/pelvic pain, dysuria, nausea or vomiting. She was also told about the possibility of having copious yellow to black tinged discharge. She was counseled to avoid anything in the vagina (sex/douching/tampons) for 4 weeks. She has a  2 week post-operative check to review results and assess wound healing. Follow up in 6 months for repeat pap or as needed.

## 2012-06-09 ENCOUNTER — Encounter: Payer: Self-pay | Admitting: Obstetrics & Gynecology

## 2012-06-09 ENCOUNTER — Ambulatory Visit (INDEPENDENT_AMBULATORY_CARE_PROVIDER_SITE_OTHER): Payer: Commercial Indemnity | Admitting: Obstetrics & Gynecology

## 2012-06-09 VITALS — BP 120/68 | HR 72 | Resp 16 | Ht 66.0 in | Wt 127.0 lb

## 2012-06-09 DIAGNOSIS — N924 Excessive bleeding in the premenopausal period: Secondary | ICD-10-CM

## 2012-06-09 NOTE — Progress Notes (Signed)
  Subjective:    Patient ID: Laurie Cooper, female    DOB: [birth year], 52 y.o.   MRN: [id number]  HPI  Pt presents for s/p LEEP for glandular atypia and for plan of heavy bleed pt had last year.  LEEP came back with no dysplasia.  Her [place] last month showed a thickened endometrium and ? Polyp 1 cm x .8 cm.  Pt has had no other bleeding since.  She has been perimenopausal for one year with only 4 menses over the past year.  Pt has also had some hot flashes, skin dryness, moodiness.  WE discussed the possibility of there being a polyp and rare chance of the polyp being dysplastic or malignant.  Offered sonohysterogram and hysteroscopy.  Pt has opted to wait and see if irregular or heavy bleeding occurs again.  She knows to come in with spotting, heavy bleeding, or 2 menses in one month.  Review of Systems Discharge after LEEP    Objective:   Physical Exam  HENT:  Head: Atraumatic.  Abdominal: Soft. She exhibits no distension. There is no tenderness.  Genitourinary: Vagina normal.  Cervix healing.  No bleeding or signs of infection  Musculoskeletal: She exhibits no edema.  Skin: Skin is warm and dry.  Psychiatric: She has a normal mood and affect.    Filed Vitals:   06/09/12 0727  BP: 120/68  Pulse: 72  Resp: 16  Height: 5\' 6"  (1.676 m)  Weight: 127 lb (57.607 kg)          Assessment & Plan:  52 yo female s/p LEEP and discussion of uterine bleeding  1-No sex for 1 week or until discharge stops 2-Monitor for signs of irregular bleeding as outlined above. 3-Pap in 3 years.  No dysplasia. 4-Yearly mammograms  25 minutes face to face time with > 50% counseling

## 2012-06-10 ENCOUNTER — Ambulatory Visit: Payer: Commercial Indemnity | Admitting: Obstetrics & Gynecology

## 2013-10-20 ENCOUNTER — Institutional Professional Consult (permissible substitution): Payer: Commercial Indemnity | Admitting: Sports Medicine

## 2013-10-20 ENCOUNTER — Encounter: Payer: Self-pay | Admitting: Family Medicine

## 2013-10-20 ENCOUNTER — Ambulatory Visit (INDEPENDENT_AMBULATORY_CARE_PROVIDER_SITE_OTHER): Payer: Commercial Indemnity | Admitting: Family Medicine

## 2013-10-20 VITALS — BP 116/65 | HR 73 | Wt 126.0 lb

## 2013-10-20 DIAGNOSIS — M25571 Pain in right ankle and joints of right foot: Secondary | ICD-10-CM

## 2013-10-20 DIAGNOSIS — M25579 Pain in unspecified ankle and joints of unspecified foot: Secondary | ICD-10-CM

## 2013-10-20 MED ORDER — DICLOFENAC SODIUM 1 % TD GEL: 2.0000 g | Freq: Three times a day (TID) | TRANSDERMAL | Status: DC

## 2013-10-20 NOTE — Progress Notes (Signed)
CC: Laurie Cooper is a 53 y.o. female is here for right ankle swelling   Subjective: HPI:  Right ankle pain and swelling that has been present for the last month with respect to pain and one week with respect to swelling. It is worse at the end of the day. Slightly improved first thing in the morning. It is also worse the more she uses it such as when moving her daughter out of her house this past weekend. Interventions have included heat, ibuprofen with only mild improvement of pain. She denies any weakness in the right lower extremity, recent or remote trauma, nor overlying skin changes. She denies ankle instability.  Denies edema, swelling elsewhere, nor motor or sensory disturbances in the right lower extremity.   Review Of Systems Outlined In HPI  Past Medical History  Diagnosis Date  . Kidney stone   . Ovarian cyst     Past Surgical History  Procedure Laterality Date  . Rotator cuff repair      left  . Tonsillectomy and adenoidectomy     Family History  Problem Relation Age of Onset  . Hodgkin's lymphoma Mother   . Diabetes Maternal Grandmother   . Diabetes Paternal Grandmother     History   Social History  . Marital Status: Married    Spouse Name: N/A    Number of Children: N/A  . Years of Education: N/A   Occupational History  . teacher    Social History Main Topics  . Smoking status: Never Smoker   . Smokeless tobacco: Never Used  . Alcohol Use: Yes     Comment: rarely  . Drug Use: No  . Sexual Activity: Yes    Partners: Male   Other Topics Concern  . Not on file   Social History Narrative  . No narrative on file     Objective: BP 116/65  Pulse 73  Wt 126 lb (57.153 kg)  General: Alert and Oriented, No Acute Distress HEENT: Pupils equal, round, reactive to light. Conjunctivae clear.  Moist mucous membranes Lungs: Clear comfortable work of breathing Cardiac: Regular rate and rhythm.  Extremities: No peripheral edema.  Strong peripheral pulses.  Right ankle exam shows no pain over medial or lateral malleoli, no pain at the base of the fifth metatarsal, no pain at the navicular. She has full range of motion strength in all planes of motion in the right ankle without reproduction of pain. There is mild to moderate swelling just anterior to the right lateral malleoli which is not tender to palpation. Mental Status: No depression, anxiety, nor agitation. Skin: Warm and dry.  Assessment & Plan: Laurie Cooper was seen today for right ankle swelling.  Diagnoses and associated orders for this visit:  Right ankle pain - DG Ankle Complete Right; Future - diclofenac sodium (VOLTAREN) 1 % GEL; Apply 2 g topically 3 (three) times daily.    Right ankle pain: Initial suspicion of bursitis, curbside with Dr. Darene Lamer. in sports medicine with bedside ultrasound shows fluid accumulation in the tendon sheaths just anterior to the lateral malleoli. Canceling x-ray, start home rehabilitation plan with resistant bands, start topical Voltaren, followup in 3-4 weeks if no improvement with Dr. Darene Lamer. in sports medicine  Return if symptoms worsen or fail to improve.

## 2013-10-25 ENCOUNTER — Encounter: Payer: Self-pay | Admitting: Obstetrics & Gynecology

## 2013-10-25 ENCOUNTER — Ambulatory Visit (INDEPENDENT_AMBULATORY_CARE_PROVIDER_SITE_OTHER): Payer: Commercial Indemnity | Admitting: Obstetrics & Gynecology

## 2013-10-25 VITALS — BP 123/73 | HR 68 | Resp 16 | Ht 66.0 in | Wt 126.0 lb

## 2013-10-25 DIAGNOSIS — Z01419 Encounter for gynecological examination (general) (routine) without abnormal findings: Secondary | ICD-10-CM

## 2013-10-25 DIAGNOSIS — Z1151 Encounter for screening for human papillomavirus (HPV): Secondary | ICD-10-CM

## 2013-10-25 DIAGNOSIS — Z124 Encounter for screening for malignant neoplasm of cervix: Secondary | ICD-10-CM

## 2013-10-25 LAB — HM MAMMOGRAPHY

## 2013-10-25 NOTE — Progress Notes (Signed)
  Subjective:    Laurie Cooper is a 53 y.o. female who presents for an annual exam. The patient has no complaints today. The patient is sexually active. GYN screening history: Atypical glandular cells favor reactive.  Negative LEEP. The patient wears seatbelts: yes. The patient participates in regular exercise: yes. Has the patient ever been transfused or tattooed?: not asked. The patient reports that there is not domestic violence in her life.   Pt is having hot flashes.  No menses since Feb 2015.  Pt had menses about once a quarter with molimina.  This is the longest she has gone without a period.  Pt knows she is to come see me if she begins to have irregular bleeding or spotting.    Menstrual History: OB History   Grav Para Term Preterm Abortions TAB SAB Ect Mult Living   3 2 2  1  1   2      Patient's last menstrual period was 05/29/2013.    The following portions of the patient's history were reviewed and updated as appropriate: allergies, current medications, past family history, past medical history, past social history, past surgical history and problem list.  Review of Systems Pertinent items are noted in HPI.    Objective:      Filed Vitals:   10/25/13 1543  BP: 123/73  Pulse: 68  Resp: 16  Height: 5\' 6"  (1.676 m)  Weight: 126 lb (57.153 kg)   Vitals:  WNL General appearance: alert, cooperative and no distress Head: Normocephalic, without obvious abnormality, atraumatic Eyes: negative Throat: lips, mucosa, and tongue normal; teeth and gums normal Lungs: clear to auscultation bilaterally Breasts: normal appearance, no masses or tenderness, No nipple retraction or dimpling, No nipple discharge or bleeding Heart: regular rate and rhythm Abdomen: soft, non-tender; bowel sounds normal; no masses,  no organomegaly  Pelvic:  External Genitalia:  Tanner V, no lesion Urethra:  No prolapse Vagina:  Pink, normal rugae, no blood or discharge Cervix:  No CMT, no  lesion Uterus:  Normal size and contour, non tender Adnexa:  Normal, no masses, non tender Rectovaginal Septum:  Non tender, no masses  Extremities: no edema, redness or tenderness in the calves or thighs Skin: no lesions or rash Lymph nodes: Axillary adenopathy: none     .    Assessment:    Healthy female exam.    Plan:     Thin prep Pap smear. with cotesting Mammogram today. Pt has had colonoscopy. Contact us with irregular bleeding.

## 2013-10-25 NOTE — Addendum Note (Signed)
Addended by: Asencion Islam on: 10/25/2013 04:38 PM   Modules accepted: Orders

## 2013-10-26 ENCOUNTER — Encounter: Payer: Self-pay | Admitting: *Deleted

## 2013-10-27 LAB — CYTOLOGY - PAP

## 2013-10-31 ENCOUNTER — Telehealth: Payer: Self-pay | Admitting: *Deleted

## 2013-10-31 NOTE — Telephone Encounter (Signed)
Pt notified of neg pap smear results.

## 2014-02-27 ENCOUNTER — Encounter: Payer: Self-pay | Admitting: Obstetrics & Gynecology

## 2014-11-02 ENCOUNTER — Encounter: Payer: Self-pay | Admitting: Obstetrics & Gynecology

## 2014-11-02 ENCOUNTER — Ambulatory Visit (INDEPENDENT_AMBULATORY_CARE_PROVIDER_SITE_OTHER): Payer: Commercial Indemnity | Admitting: Obstetrics & Gynecology

## 2014-11-02 VITALS — BP 102/63 | HR 67 | Resp 16 | Ht 66.0 in | Wt 124.0 lb

## 2014-11-02 DIAGNOSIS — Z124 Encounter for screening for malignant neoplasm of cervix: Secondary | ICD-10-CM | POA: Diagnosis not present

## 2014-11-02 DIAGNOSIS — Z01419 Encounter for gynecological examination (general) (routine) without abnormal findings: Secondary | ICD-10-CM

## 2014-11-02 DIAGNOSIS — Z1151 Encounter for screening for human papillomavirus (HPV): Secondary | ICD-10-CM

## 2014-11-02 NOTE — Progress Notes (Signed)
  Subjective:     Laurie Cooper is a 54 y.o. female here for a routine exam.  Current complaints: Pt had 8 months with no menses.  Then she had two menses, with no hot flashe sand she had breast tenderness with menses.      Gynecologic History Patient's last menstrual period was 10/20/2014. Contraception: none Last Pap: 2015. Results were: normal  (Hx of AGUS 2 years ago) Last mammogram: 2015. Results were: normal  Obstetric History OB History  Gravida Para Term Preterm AB SAB TAB Ectopic Multiple Living  3 2 2  1 1    2     # Outcome Date GA Lbr Len/2nd Weight Sex Delivery Anes PTL Lv  3 SAB           2 Term      Vag-Spont     1 Term      Vag-Spont          The following portions of the patient's history were reviewed and updated as appropriate: allergies, current medications, past family history, past medical history, past social history, past surgical history and problem list.  Review of Systems A comprehensive review of systems was negative.    Objective:      Filed Vitals:   11/02/14 1331  BP: 102/63  Pulse: 67  Resp: 16  Height: 5\' 6"  (1.676 m)  Weight: 124 lb (56.246 kg)   Vitals:  WNL General appearance: alert, cooperative and no distress Head: Normocephalic, without obvious abnormality, atraumatic Eyes: negative Throat: lips, mucosa, and tongue normal; teeth and gums normal Lungs: clear to auscultation bilaterally Breasts: normal appearance, no masses or tenderness, No nipple retraction or dimpling, No nipple discharge or bleeding Heart: regular rate and rhythm Abdomen: soft, non-tender; bowel sounds normal; no masses,  no organomegaly  Pelvic:  External Genitalia:  Tanner V, no lesion Urethra: No prolpase Vagina:  Pink, normal rugae, no blood or discharge Cervix:  No CMT, no lesion Uterus:  Normal size and contour, non tender Adnexa:  Normal, no masses, non tender  Extremities: no edema, redness or tenderness in the calves or thighs Skin: no  lesions or rash Lymph nodes: Axillary adenopathy: none        Assessment:    Healthy female exam.   Perimenopausal   Plan:    Education reviewed: skin cancer screening. Contraception: none over 50. Mammogram ordered. Follow up in: 1 year. labs (needs PCP); will check Vit D due to history of low Vit D levels  Check TSH, FSH

## 2014-11-07 ENCOUNTER — Other Ambulatory Visit (INDEPENDENT_AMBULATORY_CARE_PROVIDER_SITE_OTHER): Payer: Commercial Indemnity

## 2014-11-07 ENCOUNTER — Other Ambulatory Visit: Payer: Self-pay | Admitting: Obstetrics & Gynecology

## 2014-11-07 DIAGNOSIS — Z01419 Encounter for gynecological examination (general) (routine) without abnormal findings: Secondary | ICD-10-CM

## 2014-11-07 LAB — CYTOLOGY - PAP

## 2014-11-08 ENCOUNTER — Other Ambulatory Visit: Payer: Self-pay | Admitting: Obstetrics & Gynecology

## 2014-11-08 ENCOUNTER — Telehealth: Payer: Self-pay | Admitting: *Deleted

## 2014-11-08 LAB — CBC
HEMATOCRIT: 36.8 % (ref 36.0–46.0)
Hemoglobin: 11.5 g/dL — ABNORMAL LOW (ref 12.0–15.0)
MCH: 25.8 pg — ABNORMAL LOW (ref 26.0–34.0)
MCHC: 31.3 g/dL (ref 30.0–36.0)
MCV: 82.5 fL (ref 78.0–100.0)
MPV: 9.4 fL (ref 8.6–12.4)
PLATELETS: 335 10*3/uL (ref 150–400)
RBC: 4.46 MIL/uL (ref 3.87–5.11)
RDW: 15.6 % — AB (ref 11.5–15.5)
WBC: 5.3 10*3/uL (ref 4.0–10.5)

## 2014-11-08 LAB — FOLLICLE STIMULATING HORMONE: FSH: 140.4 m[IU]/mL — AB

## 2014-11-08 LAB — COMPREHENSIVE METABOLIC PANEL
ALBUMIN: 4.1 g/dL (ref 3.5–5.2)
ALK PHOS: 61 U/L (ref 39–117)
ALT: 8 U/L (ref 0–35)
AST: 12 U/L (ref 0–37)
BUN: 10 mg/dL (ref 6–23)
CO2: 28 meq/L (ref 19–32)
Calcium: 9.4 mg/dL (ref 8.4–10.5)
Chloride: 107 mEq/L (ref 96–112)
Creat: 0.69 mg/dL (ref 0.50–1.10)
GLUCOSE: 96 mg/dL (ref 70–99)
Potassium: 5.2 mEq/L (ref 3.5–5.3)
Sodium: 142 mEq/L (ref 135–145)
Total Bilirubin: 0.5 mg/dL (ref 0.2–1.2)
Total Protein: 6.5 g/dL (ref 6.0–8.3)

## 2014-11-08 LAB — LIPID PANEL
Cholesterol: 227 mg/dL — ABNORMAL HIGH (ref 0–200)
HDL: 62 mg/dL (ref >=46)
LDL Cholesterol: 147 mg/dL — ABNORMAL HIGH (ref 0–99)
Total CHOL/HDL Ratio: 3.7 Ratio
Triglycerides: 89 mg/dL (ref <150)
VLDL: 18 mg/dL (ref 0–40)

## 2014-11-08 LAB — TSH: TSH: 0.671 u[IU]/mL (ref 0.350–4.500)

## 2014-11-08 LAB — VITAMIN D 25 HYDROXY (VIT D DEFICIENCY, FRACTURES): Vit D, 25-Hydroxy: 25 ng/mL — ABNORMAL LOW (ref 30–100)

## 2014-11-08 MED ORDER — VITAMIN D3 10 MCG (400 UNIT) PO TABS: ORAL_TABLET | ORAL | Status: DC

## 2014-11-08 NOTE — Progress Notes (Signed)
Pt notified of labs and she is to start her Vitamin D and repeat Vit D level in 3 months.  She is aware of her Cholesterol levels and is to f/u with Dr Joellyn Quails for that.

## 2014-11-08 NOTE — Telephone Encounter (Signed)
Pt notified of lab work.  She is to start her Vitamin D daily and recheck in 3 months.  She is aware of her Cholesterol and will f/u with Dr Suzi Roots her PCP for that.

## 2014-11-09 ENCOUNTER — Encounter: Payer: Self-pay | Admitting: Obstetrics & Gynecology

## 2014-11-09 DIAGNOSIS — E785 Hyperlipidemia, unspecified: Secondary | ICD-10-CM | POA: Insufficient documentation

## 2014-11-23 ENCOUNTER — Encounter: Payer: Self-pay | Admitting: Cardiology

## 2015-02-28 ENCOUNTER — Encounter: Payer: Self-pay | Admitting: *Deleted

## 2015-05-15 ENCOUNTER — Ambulatory Visit (INDEPENDENT_AMBULATORY_CARE_PROVIDER_SITE_OTHER): Payer: Commercial Indemnity | Admitting: Family Medicine

## 2015-05-15 ENCOUNTER — Encounter: Payer: Self-pay | Admitting: Family Medicine

## 2015-05-15 VITALS — BP 105/66 | HR 71 | Wt 123.0 lb

## 2015-05-15 DIAGNOSIS — R05 Cough: Secondary | ICD-10-CM

## 2015-05-15 DIAGNOSIS — M6740 Ganglion, unspecified site: Secondary | ICD-10-CM

## 2015-05-15 DIAGNOSIS — R059 Cough, unspecified: Secondary | ICD-10-CM

## 2015-05-15 MED ORDER — METHYLPREDNISOLONE 4 MG PO TBPK: ORAL_TABLET | ORAL | Status: DC

## 2015-05-15 NOTE — Progress Notes (Signed)
CC: Genavive C Brain is a 55 y.o. female is here for No energy and Shortness of Breath   Subjective: HPI: Ever since Christmas she's been experiencing a nonproductive cough. Was originally coming by nasal congestion and facial pressure but this resolved on its own. Interventions have included cough medicine and cough drops but no benefit. It's never present when she is lying down to sleep but only present during waking hours. It's worse the more she talks. Nothing else makes it better or worse however it's persistent and mild in severity. Denies wheezing, chest pain or shortness of breath. Denies fevers, chills or sore throat  On the interphalangeal joint of the left hand, thumb she has a small tender mass that been present for a few weeks. It's not getting bigger or smaller. She denies any trauma. It's slightly painful if she flexes her thumb to the extreme.   Review Of Systems Outlined In HPI  Past Medical History  Diagnosis Date  . Kidney stone   . Ovarian cyst     Past Surgical History  Procedure Laterality Date  . Rotator cuff repair      left  . Tonsillectomy and adenoidectomy     Family History  Problem Relation Age of Onset  . Hodgkin's lymphoma Mother   . Cancer Mother   . Diabetes Maternal Grandmother   . Diabetes Paternal Grandmother   . Parkinson's disease Father     Social History   Social History  . Marital Status: Married    Spouse Name: N/A  . Number of Children: N/A  . Years of Education: N/A   Occupational History  . teacher    Social History Main Topics  . Smoking status: Never Smoker   . Smokeless tobacco: Never Used  . Alcohol Use: Yes     Comment: rarely  . Drug Use: No  . Sexual Activity:    Partners: Male   Other Topics Concern  . Not on file   Social History Narrative     Objective: BP 105/66 mmHg  Pulse 71  Wt 123 lb (55.792 kg)  SpO2 94%  General: Alert and Oriented, No Acute Distress HEENT: Pupils equal, round, reactive to  light. Conjunctivae clear.  External ears unremarkable, canals clear with intact TMs with appropriate landmarks.  Middle ear appears open without effusion. Pink inferior turbinates.  Moist mucous membranes, pharynx without inflammation nor lesions.  Neck supple without palpable lymphadenopathy nor abnormal masses. Lungs: Clear to auscultation bilaterally, no wheezing/ronchi/rales.  Comfortable work of breathing. Good air movement Extremities: No peripheral edema.  Strong peripheral pulses. Small rubbery nodule on the left thumb interphalangeal joint, about the size of a gr of rice Mental Status: No depression, anxiety, nor agitation. Skin: Warm and dry.  Assessment & Plan: Johnathon was seen today for no energy and shortness of breath.  Diagnoses and all orders for this visit:  Cough -     methylPREDNISolone (MEDROL DOSEPAK) 4 MG TBPK tablet; Take as directed by dosepak.  Ganglion cyst   Cough with a component of laryngitis, the longer we were talking the more her voice seemed to diminish in quality. Low suspicion of lung disease. Starting methylprednisolone taper. Discussed benign nature of ganglion cyst and that this will likely go away within the next 6 months without any particular intervention.   Return if symptoms worsen or fail to improve.

## 2016-06-25 ENCOUNTER — Ambulatory Visit (INDEPENDENT_AMBULATORY_CARE_PROVIDER_SITE_OTHER): Payer: Managed Care, Other (non HMO) | Admitting: Obstetrics & Gynecology

## 2016-06-25 ENCOUNTER — Encounter: Payer: Self-pay | Admitting: Obstetrics & Gynecology

## 2016-06-25 VITALS — BP 127/79 | HR 92 | Resp 16 | Ht 66.0 in | Wt 127.0 lb

## 2016-06-25 DIAGNOSIS — Z0000 Encounter for general adult medical examination without abnormal findings: Secondary | ICD-10-CM

## 2016-06-25 DIAGNOSIS — Z01419 Encounter for gynecological examination (general) (routine) without abnormal findings: Secondary | ICD-10-CM | POA: Diagnosis not present

## 2016-06-25 DIAGNOSIS — Z8041 Family history of malignant neoplasm of ovary: Secondary | ICD-10-CM

## 2016-06-25 NOTE — Progress Notes (Signed)
Subjective:    Laurie Cooper is a 56 y.o. female who presents for an annual exam. The patient is now in menopause and and hot flashes at night.  She does not want to take meds at this time. The patient is sexually active. GYN screening history: last pap: was normal. The patient wears seatbelts: yes. The patient participates in regular exercise: yes. Has the patient ever been transfused or tattooed?: not asked. The patient reports that there is not domestic violence in her life.   Menstrual History: OB History    Gravida Para Term Preterm AB Living   3 2 2   1 2    SAB TAB Ectopic Multiple Live Births   1             Patient's last menstrual period was 10/20/2014.    The following portions of the patient's history were reviewed and updated as appropriate: allergies, current medications, past family history, past medical history, past social history, past surgical history and problem list  New family history of Aunt with ovarian cancer.  Offered myrsik testing but has declined.    Review of Systems Pertinent items noted in HPI and remainder of comprehensive ROS otherwise negative.    Objective:      Vitals:   06/25/16 0859  BP: 127/79  Pulse: 92  Resp: 16  Weight: 127 lb (57.6 kg)  Height: 5\' 6"  (1.676 m)   Vitals:  WNL General appearance: alert, cooperative and no distress  HEENT: Normocephalic, without obvious abnormality, atraumatic Eyes: negative Throat: lips, mucosa, and tongue normal; teeth and gums normal  Respiratory: Clear to auscultation bilaterally  CV: Regular rate and rhythm  Breasts:  Normal appearance, no masses or tenderness, no nipple retraction or dimpling  GI: Soft, non-tender; bowel sounds normal; no masses,  no organomegaly  GU: External Genitalia:  Tanner V, no lesion Urethra:  No prolapse   Vagina: Pale pink mucosa, no discharge or lesion  Cervix: No CMT, no lesion  Uterus:  Normal size and contour, non tender  Adnexa: Normal, no masses, non tender   Musculoskeletal: No edema, redness or tenderness in the calves or thighs  Skin: No lesions or rash  Lymphatic: Axillary adenopathy: none     Psychiatric: Normal mood and behavior   Vitals:   06/25/16 0859  BP: 127/79  Pulse: 92  Resp: 16  Weight: 127 lb (57.6 kg)  Height: 5\' 6"  (1.676 m)    Assessment:    Healthy female exam.    Plan:    1.  Pap with cotesting--history of AGUS 2.  Mammogram yearly 3.  Vit D check 4.  Lipid panel, cbc, cmp 5.  Colonoscopy at 45 6.  Dexa at 3 years postmenopause 7.  My risk offered but declined.

## 2016-06-26 LAB — CYTOLOGY - PAP
DIAGNOSIS: NEGATIVE
HPV: NOT DETECTED

## 2016-07-01 ENCOUNTER — Other Ambulatory Visit (INDEPENDENT_AMBULATORY_CARE_PROVIDER_SITE_OTHER): Payer: Managed Care, Other (non HMO)

## 2016-07-01 ENCOUNTER — Other Ambulatory Visit: Payer: Self-pay | Admitting: Obstetrics & Gynecology

## 2016-07-01 DIAGNOSIS — Z780 Asymptomatic menopausal state: Secondary | ICD-10-CM

## 2016-07-02 LAB — COMPREHENSIVE METABOLIC PANEL
ALK PHOS: 63 U/L (ref 33–130)
ALT: 9 U/L (ref 6–29)
AST: 12 U/L (ref 10–35)
Albumin: 4.1 g/dL (ref 3.6–5.1)
BILIRUBIN TOTAL: 0.5 mg/dL (ref 0.2–1.2)
BUN: 13 mg/dL (ref 7–25)
CHLORIDE: 105 mmol/L (ref 98–110)
CO2: 27 mmol/L (ref 20–31)
CREATININE: 0.74 mg/dL (ref 0.50–1.05)
Calcium: 9.3 mg/dL (ref 8.6–10.4)
Glucose, Bld: 97 mg/dL (ref 65–99)
Potassium: 4.3 mmol/L (ref 3.5–5.3)
SODIUM: 139 mmol/L (ref 135–146)
TOTAL PROTEIN: 6.3 g/dL (ref 6.1–8.1)

## 2016-07-02 LAB — CBC
HCT: 32.2 % — ABNORMAL LOW (ref 35.0–45.0)
HEMOGLOBIN: 9.9 g/dL — AB (ref 11.7–15.5)
MCH: 23.5 pg — ABNORMAL LOW (ref 27.0–33.0)
MCHC: 30.7 g/dL — ABNORMAL LOW (ref 32.0–36.0)
MCV: 76.3 fL — ABNORMAL LOW (ref 80.0–100.0)
MPV: 9.3 fL (ref 7.5–12.5)
PLATELETS: 371 10*3/uL (ref 140–400)
RBC: 4.22 MIL/uL (ref 3.80–5.10)
RDW: 16.3 % — ABNORMAL HIGH (ref 11.0–15.0)
WBC: 5.1 10*3/uL (ref 3.8–10.8)

## 2016-07-02 LAB — LIPID PANEL
CHOLESTEROL: 210 mg/dL — AB (ref <200)
HDL: 64 mg/dL (ref >50)
LDL Cholesterol: 128 mg/dL — ABNORMAL HIGH (ref <100)
Total CHOL/HDL Ratio: 3.3 Ratio (ref <5.0)
Triglycerides: 91 mg/dL (ref <150)
VLDL: 18 mg/dL (ref <30)

## 2016-07-02 LAB — TSH: TSH: 0.65 mIU/L

## 2016-07-02 LAB — VITAMIN D 25 HYDROXY (VIT D DEFICIENCY, FRACTURES): VIT D 25 HYDROXY: 44 ng/mL (ref 30–100)

## 2016-07-03 ENCOUNTER — Other Ambulatory Visit: Payer: Self-pay | Admitting: Obstetrics & Gynecology

## 2016-07-03 DIAGNOSIS — D649 Anemia, unspecified: Secondary | ICD-10-CM

## 2016-07-03 DIAGNOSIS — E785 Hyperlipidemia, unspecified: Secondary | ICD-10-CM

## 2016-07-03 NOTE — Progress Notes (Signed)
Patient ID: Laurie Cooper, female   DOB: 1960/09/28, 56 y.o.   MRN: [id number]   Pt anemic and has high cholesterol level.  Pt needs anemia work up and hsCRP. Reinforce low cholesterol diet.  Refer to PCP.

## 2016-07-07 LAB — FERRITIN: Ferritin: 5 ng/mL — ABNORMAL LOW (ref 10–232)

## 2016-07-07 LAB — VITAMIN B12: VITAMIN B 12: 278 pg/mL (ref 200–1100)

## 2016-07-08 LAB — HIGH SENSITIVITY CRP: CRP, High Sensitivity: 0.3 mg/L

## 2016-07-09 ENCOUNTER — Telehealth: Payer: Self-pay

## 2016-07-09 NOTE — Telephone Encounter (Signed)
Spoke with pt to let her know that her results from the CRP High Sensitivity blood work showed that at age 56, her chance of having a heart attack, stroke, or other heart disease event at some point in the next 10-years is 1 percent. She is aware of this and understood these results.

## 2016-07-10 ENCOUNTER — Telehealth: Payer: Self-pay | Admitting: *Deleted

## 2016-07-10 NOTE — Telephone Encounter (Signed)
Pt notified of Ferritin levels and that she does need to F/U with her PCP to evaluate her anemia.

## 2016-07-10 NOTE — Telephone Encounter (Signed)
-----   Message from Guss Bunde, MD sent at 07/09/2016  3:03 PM EDT ----- Pt needs to f/u with primary care regarding anemia.

## 2016-11-27 ENCOUNTER — Ambulatory Visit (INDEPENDENT_AMBULATORY_CARE_PROVIDER_SITE_OTHER): Payer: Managed Care, Other (non HMO)

## 2016-11-27 ENCOUNTER — Encounter: Payer: Self-pay | Admitting: Obstetrics & Gynecology

## 2016-11-27 ENCOUNTER — Ambulatory Visit (INDEPENDENT_AMBULATORY_CARE_PROVIDER_SITE_OTHER): Payer: Managed Care, Other (non HMO) | Admitting: Obstetrics & Gynecology

## 2016-11-27 VITALS — BP 107/74 | HR 66 | Resp 16 | Ht 66.0 in | Wt 127.0 lb

## 2016-11-27 DIAGNOSIS — R938 Abnormal findings on diagnostic imaging of other specified body structures: Secondary | ICD-10-CM

## 2016-11-27 DIAGNOSIS — R140 Abdominal distension (gaseous): Secondary | ICD-10-CM

## 2016-11-27 DIAGNOSIS — N950 Postmenopausal bleeding: Secondary | ICD-10-CM

## 2016-11-27 NOTE — Progress Notes (Signed)
   Subjective:    Patient ID: Laurie Cooper, female    DOB: [birth year], 56 y.o.   MRN: [id number]  HPI  56 year old postmenopausal female presents with onset of bloating and abdominal distention distention. She is no longer expressing the symptoms. She did have 3 days of vaginal spotting on vacation recently. She knows that she is postmenopausal for the past 2 years and should not be having any bleeding. Patient denies any nausea vomiting diarrhea or early satiety  Review of Systems  Respiratory: Negative.   Cardiovascular: Negative.   Gastrointestinal: Positive for abdominal distention. Negative for blood in stool, constipation, diarrhea, nausea and vomiting.  Genitourinary: Positive for vaginal bleeding. Negative for pelvic pain.  Psychiatric/Behavioral: Negative.        Objective:   Physical Exam  Constitutional: She is oriented to person, place, and time. She appears well-developed and well-nourished. No distress.  HENT:  Head: Normocephalic and atraumatic.  Eyes: Conjunctivae are normal.  Pulmonary/Chest: Effort normal.  Abdominal: Soft. She exhibits no distension. There is no tenderness.  Genitourinary: Vagina normal and uterus normal.  Genitourinary Comments: No blood nor cervical lesion Adnexa--nontender, no mass felt   Musculoskeletal: She exhibits no edema.  Neurological: She is alert and oriented to person, place, and time.  Skin: Skin is warm and dry.  Psychiatric: She has a normal mood and affect.  Vitals reviewed.  Vitals:   11/27/16 1300  BP: 107/74  Pulse: 66  Resp: 16  Weight: 127 lb (57.6 kg)  Height: 5\' 6"  (1.676 m)       Assessment & Plan:  56 yo female with PMB bleeding   1-Transvaginal ultrasound 2-if thickened will proceed with endometrial biopsy. 3-mammogram due this fall

## 2016-11-28 ENCOUNTER — Telehealth: Payer: Self-pay | Admitting: *Deleted

## 2016-11-28 DIAGNOSIS — N938 Other specified abnormal uterine and vaginal bleeding: Secondary | ICD-10-CM

## 2016-11-28 MED ORDER — MISOPROSTOL 200 MCG PO TABS: ORAL_TABLET | ORAL | 0 refills | Status: DC

## 2016-11-28 NOTE — Telephone Encounter (Signed)
Pt notified of U/S results.  Pt is scheduled for Endometrial Biopsy and Cytotec was sent to her pharmacy to place into vagina the night prior to procedure.  Informed pt to take Ibuprofen 800 mg 30 minutes prior to procedure.

## 2016-12-03 ENCOUNTER — Encounter: Payer: Self-pay | Admitting: *Deleted

## 2016-12-03 ENCOUNTER — Ambulatory Visit (INDEPENDENT_AMBULATORY_CARE_PROVIDER_SITE_OTHER): Payer: Managed Care, Other (non HMO) | Admitting: Obstetrics & Gynecology

## 2016-12-03 ENCOUNTER — Encounter: Payer: Self-pay | Admitting: Obstetrics & Gynecology

## 2016-12-03 VITALS — BP 112/72 | HR 66 | Resp 16 | Ht 66.0 in

## 2016-12-03 DIAGNOSIS — Z3202 Encounter for pregnancy test, result negative: Secondary | ICD-10-CM | POA: Diagnosis not present

## 2016-12-03 DIAGNOSIS — N950 Postmenopausal bleeding: Secondary | ICD-10-CM

## 2016-12-03 LAB — POCT URINE PREGNANCY: PREG TEST UR: NEGATIVE

## 2016-12-03 NOTE — Progress Notes (Signed)
   Subjective:    Patient ID: Laurie Cooper, female    DOB: [birth year], 56 y.o.   MRN: [id number]  HPI  Patient's 56 year old female who presents for endometrial biopsy. We discussed in detail her ultrasound. It appears that she has a polyp. I reviewed the images with her. Both ovaries are normal.  Review of Systems  Constitutional: Negative.   Cardiovascular: Negative.   Gastrointestinal: Negative.   Genitourinary: Negative for pelvic pain and vaginal bleeding.       Objective:   Physical Exam  Constitutional: She is oriented to person, place, and time. She appears well-developed and well-nourished. No distress.  HENT:  Head: Normocephalic and atraumatic.  Eyes: Conjunctivae are normal.  Pulmonary/Chest: Effort normal.  Abdominal: Soft. She exhibits no distension. There is no tenderness.  Genitourinary: Vagina normal and uterus normal.  Musculoskeletal: She exhibits no edema.  Neurological: She is alert and oriented to person, place, and time.  Skin: Skin is warm and dry.  Psychiatric: She has a normal mood and affect.  Vitals reviewed.  Vitals:   12/03/16 1025  BP: 112/72  Pulse: 66  Resp: 16  Height: 5\' 6"  (1.676 m)    Assessment & Plan:  56 year old female with postmenopausal bleeding and suspected polyp on ultrasound discussed possible treatment plans including no diagnostic procedures with saline sono versus going to the operating room. We will base final decision on the pathology.  15 minutes spent face-to-face with patient with greater than 50% counseling  ENDOMETRIAL BIOPSY     The indications for endometrial biopsy were reviewed.   Risks of the biopsy including cramping, bleeding, infection, uterine perforation, inadequate specimen and need for additional procedures  were discussed. The patient states she understands and agrees to undergo procedure today. Consent was signed. Time out was performed. Urine HCG was negative. A sterile speculum was placed in the  patient's vagina and the cervix was prepped with Betadine. A single-toothed tenaculum was placed on the anterior lip of the cervix to stabilize it.   Cervix had to be dilated with disposable dilators. The Pap smear brush was also used to breakthrough the scar tissue of prior LEEP procedure.  The 3 mm pipelle was introduced into the endometrial cavity without difficulty to a depth of 7 cm, and a moderate amount of tissue was obtained and sent to pathology. The instruments were removed from the patient's vagina. Minimal bleeding from the cervix was noted. The patient tolerated the procedure well. Routine post-procedure instructions were given to the patient. The patient will follow up to review the results and for further management.

## 2016-12-04 ENCOUNTER — Telehealth: Payer: Self-pay

## 2016-12-04 NOTE — Telephone Encounter (Signed)
Patient called and made aware that [place] and biopsy is consistent with polyp. Patient would like to proceed with hysteroscopy, D&C, and polypectomy. Patient would just like to avoid 08-21 to 12-18-16 as possible surgery dates.  Kathrene Alu RNBSN

## 2016-12-12 ENCOUNTER — Telehealth: Payer: Self-pay | Admitting: *Deleted

## 2016-12-12 DIAGNOSIS — N938 Other specified abnormal uterine and vaginal bleeding: Secondary | ICD-10-CM

## 2016-12-12 MED ORDER — MISOPROSTOL 200 MCG PO TABS: ORAL_TABLET | ORAL | 0 refills | Status: DC

## 2016-12-12 NOTE — Telephone Encounter (Signed)
Pt notified of need for Cytotec the night prior to surgery.  RX was sent to [facility]' in [place].

## 2016-12-12 NOTE — Telephone Encounter (Signed)
-----   Message from Guss Bunde, MD sent at 12/12/2016  6:43 AM EDT ----- Pt scheduled for polypectomy.  Cytotec before procedure.

## 2016-12-23 ENCOUNTER — Encounter (HOSPITAL_COMMUNITY): Payer: Self-pay

## 2016-12-25 NOTE — Anesthesia Preprocedure Evaluation (Signed)
Anesthesia Evaluation  Patient identified by MRN, date of birth, ID band Patient awake    Reviewed: Allergy & Precautions, NPO status , Patient's Chart, lab work & pertinent test results  Airway Mallampati: II  TM Distance: >3 FB Neck ROM: Full    Dental no notable dental hx.    Pulmonary neg pulmonary ROS,    Pulmonary exam normal breath sounds clear to auscultation       Cardiovascular negative cardio ROS Normal cardiovascular exam Rhythm:Regular Rate:Normal     Neuro/Psych negative neurological ROS  negative psych ROS   GI/Hepatic negative GI ROS, Neg liver ROS,   Endo/Other  negative endocrine ROS  Renal/GU negative Renal ROS  negative genitourinary   Musculoskeletal negative musculoskeletal ROS (+)   Abdominal   Peds negative pediatric ROS (+)  Hematology negative hematology ROS (+) anemia ,   Anesthesia Other Findings   Reproductive/Obstetrics negative OB ROS                             Anesthesia Physical Anesthesia Plan  ASA: II  Anesthesia Plan: General   Post-op Pain Management:    Induction: Intravenous  PONV Risk Score and Plan: 3 and Ondansetron, Dexamethasone, Midazolam and Treatment may vary due to age or medical condition  Airway Management Planned: LMA and Oral ETT  Additional Equipment:   Intra-op Plan:   Post-operative Plan:   Informed Consent:   Plan Discussed with: CRNA and Surgeon  Anesthesia Plan Comments: ( )        Anesthesia Quick Evaluation

## 2016-12-26 ENCOUNTER — Ambulatory Visit (HOSPITAL_COMMUNITY): Payer: Managed Care, Other (non HMO) | Admitting: Anesthesiology

## 2016-12-26 ENCOUNTER — Encounter (HOSPITAL_COMMUNITY)
Admission: RE | Disposition: A | Discharge status: Home / Self Care | Payer: Self-pay | Source: Ambulatory Visit | Attending: Obstetrics & Gynecology

## 2016-12-26 ENCOUNTER — Encounter (HOSPITAL_COMMUNITY): Payer: Self-pay

## 2016-12-26 ENCOUNTER — Ambulatory Visit (HOSPITAL_COMMUNITY)
Admission: RE | Admit: 2016-12-26 | Discharge: 2016-12-26 | Disposition: A | Discharge status: Home / Self Care | Payer: Managed Care, Other (non HMO) | Source: Ambulatory Visit | Attending: Obstetrics & Gynecology | Admitting: Obstetrics & Gynecology

## 2016-12-26 DIAGNOSIS — Z87442 Personal history of urinary calculi: Secondary | ICD-10-CM | POA: Insufficient documentation

## 2016-12-26 DIAGNOSIS — N840 Polyp of corpus uteri: Secondary | ICD-10-CM | POA: Insufficient documentation

## 2016-12-26 DIAGNOSIS — N950 Postmenopausal bleeding: Secondary | ICD-10-CM | POA: Diagnosis not present

## 2016-12-26 DIAGNOSIS — D259 Leiomyoma of uterus, unspecified: Secondary | ICD-10-CM | POA: Diagnosis not present

## 2016-12-26 HISTORY — PX: DILATATION & CURETTAGE/HYSTEROSCOPY WITH MYOSURE: SHX6511

## 2016-12-26 LAB — TYPE AND SCREEN
ABO/RH(D): O NEG
ANTIBODY SCREEN: NEGATIVE

## 2016-12-26 LAB — CBC
HEMATOCRIT: 38.3 % (ref 36.0–46.0)
Hemoglobin: 12.2 g/dL (ref 12.0–15.0)
MCH: 24.4 pg — ABNORMAL LOW (ref 26.0–34.0)
MCHC: 31.9 g/dL (ref 30.0–36.0)
MCV: 76.4 fL — ABNORMAL LOW (ref 78.0–100.0)
Platelets: 311 10*3/uL (ref 150–400)
RBC: 5.01 MIL/uL (ref 3.87–5.11)
RDW: 18.8 % — AB (ref 11.5–15.5)
WBC: 5.9 10*3/uL (ref 4.0–10.5)

## 2016-12-26 LAB — ABO/RH: ABO/RH(D): O NEG

## 2016-12-26 SURGERY — DILATATION & CURETTAGE/HYSTEROSCOPY WITH MYOSURE
Anesthesia: General | Site: Vagina

## 2016-12-26 MED ORDER — PROPOFOL 10 MG/ML IV BOLUS
INTRAVENOUS | Status: DC | PRN
Start: 2016-12-26 — End: 2016-12-26
  Administered 2016-12-26: 120 mg via INTRAVENOUS

## 2016-12-26 MED ORDER — LIDOCAINE HCL (CARDIAC) 20 MG/ML IV SOLN
INTRAVENOUS | Status: DC | PRN
  Administered 2016-12-26: 60 mg via INTRAVENOUS

## 2016-12-26 MED ORDER — FENTANYL CITRATE (PF) 100 MCG/2ML IJ SOLN: 25.0000 ug | INTRAMUSCULAR | Status: DC | PRN

## 2016-12-26 MED ORDER — DEXAMETHASONE SODIUM PHOSPHATE 10 MG/ML IJ SOLN
INTRAMUSCULAR | Status: DC | PRN
  Administered 2016-12-26: 8 mg via INTRAVENOUS

## 2016-12-26 MED ORDER — SCOPOLAMINE 1 MG/3DAYS TD PT72
MEDICATED_PATCH | TRANSDERMAL | Status: AC
  Administered 2016-12-26: 1.5 mg via TRANSDERMAL
  Filled 2016-12-26: qty 1

## 2016-12-26 MED ORDER — MIDAZOLAM HCL 2 MG/2ML IJ SOLN
INTRAMUSCULAR | Status: DC | PRN
  Administered 2016-12-26: 1 mg via INTRAVENOUS

## 2016-12-26 MED ORDER — MIDAZOLAM HCL 2 MG/2ML IJ SOLN
INTRAMUSCULAR | Status: AC
  Filled 2016-12-26: qty 2

## 2016-12-26 MED ORDER — SCOPOLAMINE 1 MG/3DAYS TD PT72
1.0000 | MEDICATED_PATCH | Freq: Once | TRANSDERMAL | Status: DC
  Administered 2016-12-26: 1.5 mg via TRANSDERMAL

## 2016-12-26 MED ORDER — KETOROLAC TROMETHAMINE 30 MG/ML IJ SOLN
INTRAMUSCULAR | Status: AC
Start: 2016-12-26 — End: 2016-12-26
  Filled 2016-12-26: qty 1

## 2016-12-26 MED ORDER — FENTANYL CITRATE (PF) 250 MCG/5ML IJ SOLN
INTRAMUSCULAR | Status: AC
  Filled 2016-12-26: qty 5

## 2016-12-26 MED ORDER — ONDANSETRON HCL 4 MG/2ML IJ SOLN
INTRAMUSCULAR | Status: DC | PRN
  Administered 2016-12-26: 4 mg via INTRAVENOUS

## 2016-12-26 MED ORDER — ONDANSETRON HCL 4 MG/2ML IJ SOLN: 4.0000 mg | Freq: Once | INTRAMUSCULAR | Status: DC | PRN

## 2016-12-26 MED ORDER — KETOROLAC TROMETHAMINE 30 MG/ML IJ SOLN
INTRAMUSCULAR | Status: DC | PRN
  Administered 2016-12-26: 15 mg via INTRAVENOUS

## 2016-12-26 MED ORDER — LACTATED RINGERS IV SOLN
INTRAVENOUS | Status: DC
  Administered 2016-12-26: 125 mL/h via INTRAVENOUS

## 2016-12-26 MED ORDER — GLYCOPYRROLATE 0.2 MG/ML IJ SOLN
INTRAMUSCULAR | Status: DC | PRN
  Administered 2016-12-26 (×2): 0.1 mg via INTRAVENOUS

## 2016-12-26 MED ORDER — SODIUM CHLORIDE 0.9 % IR SOLN
Status: DC | PRN
  Administered 2016-12-26: 3000 mL

## 2016-12-26 MED ORDER — ONDANSETRON HCL 4 MG/2ML IJ SOLN
INTRAMUSCULAR | Status: AC
  Filled 2016-12-26: qty 2

## 2016-12-26 MED ORDER — SILVER NITRATE-POT NITRATE 75-25 % EX MISC
CUTANEOUS | Status: DC | PRN
  Administered 2016-12-26: 1

## 2016-12-26 MED ORDER — EPHEDRINE SULFATE 50 MG/ML IJ SOLN
INTRAMUSCULAR | Status: DC | PRN
  Administered 2016-12-26: 5 mg via INTRAVENOUS

## 2016-12-26 MED ORDER — MEPERIDINE HCL 25 MG/ML IJ SOLN: 6.2500 mg | INTRAMUSCULAR | Status: DC | PRN

## 2016-12-26 MED ORDER — HYDROCODONE-ACETAMINOPHEN 5-325 MG PO TABS: ORAL_TABLET | ORAL | 0 refills | Status: DC

## 2016-12-26 MED ORDER — DEXAMETHASONE SODIUM PHOSPHATE 4 MG/ML IJ SOLN
INTRAMUSCULAR | Status: AC
  Filled 2016-12-26: qty 1

## 2016-12-26 MED ORDER — FENTANYL CITRATE (PF) 100 MCG/2ML IJ SOLN
INTRAMUSCULAR | Status: DC | PRN
  Administered 2016-12-26: 50 ug via INTRAVENOUS

## 2016-12-26 MED ORDER — LACTATED RINGERS IV SOLN
INTRAVENOUS | Status: DC
  Administered 2016-12-26 (×2): via INTRAVENOUS

## 2016-12-26 MED ORDER — LIDOCAINE HCL (CARDIAC) 20 MG/ML IV SOLN
INTRAVENOUS | Status: AC
  Filled 2016-12-26: qty 5

## 2016-12-26 SURGICAL SUPPLY — 16 items
CANISTER SUCT 3000ML PPV (MISCELLANEOUS) ×2 IMPLANT
CATH ROBINSON RED A/P 16FR (CATHETERS) ×2 IMPLANT
CLOTH BEACON ORANGE TIMEOUT ST (SAFETY) ×2 IMPLANT
CONTAINER PREFILL 10% NBF 60ML (FORM) ×4 IMPLANT
DEVICE MYOSURE LITE (MISCELLANEOUS) IMPLANT
DEVICE MYOSURE REACH (MISCELLANEOUS) ×2 IMPLANT
GLOVE BIO SURGEON STRL SZ7 (GLOVE) ×2 IMPLANT
GLOVE BIOGEL PI IND STRL 7.0 (GLOVE) ×2 IMPLANT
GLOVE BIOGEL PI INDICATOR 7.0 (GLOVE) ×2
GOWN STRL REUS W/TWL LRG LVL3 (GOWN DISPOSABLE) ×4 IMPLANT
PACK VAGINAL MINOR WOMEN LF (CUSTOM PROCEDURE TRAY) ×2 IMPLANT
PAD OB MATERNITY 4.3X12.25 (PERSONAL CARE ITEMS) ×2 IMPLANT
SEAL ROD LENS SCOPE MYOSURE (ABLATOR) ×2 IMPLANT
TOWEL OR 17X24 6PK STRL BLUE (TOWEL DISPOSABLE) ×4 IMPLANT
TUBING AQUILEX INFLOW (TUBING) ×2 IMPLANT
TUBING AQUILEX OUTFLOW (TUBING) ×2 IMPLANT

## 2016-12-26 NOTE — Anesthesia Procedure Notes (Addendum)
Procedure Name: LMA Insertion Date/Time: 12/26/2016 10:07 AM Performed by: Georgeanne Nim Pre-anesthesia Checklist: Patient identified, Patient being monitored, Timeout performed, Emergency Drugs available and Suction available Patient Re-evaluated:Patient Re-evaluated prior to induction Oxygen Delivery Method: Circle system utilized Preoxygenation: Pre-oxygenation with 100% oxygen Induction Type: IV induction Ventilation: Mask ventilation without difficulty LMA: LMA inserted LMA Size: 3.0 Number of attempts: 2 (SRNA attempted then placed by CRNA) Placement Confirmation: positive ETCO2,  CO2 detector and breath sounds checked- equal and bilateral Tube secured with: Tape Dental Injury: Teeth and Oropharynx as per pre-operative assessment

## 2016-12-26 NOTE — Op Note (Signed)
PREOPERATIVE DIAGNOSIS:  56 y.o. with irregular uterine bleeding  PRE-OPERATIVE DIAGNOSIS:  56 to female with postmenopausal bleeding and endometrial polyp on [place] and biopsy  POST-OPERATIVE DIAGNOSIS:  same  PROCEDURE:  Procedure(s) with comments: DILATATION & CURETTAGE/HYSTEROSCOPY WITH MYOSURE (N/A) - removal of polyp REP will be here confirmed on 12/23/16  SURGEON:  Surgeon(s) and Role:    * Guss Bunde, MD - Primary  ASSISTANTS: none   ANESTHESIA:   general  EBL:  Total I/O In: 1400 [I.V.:1400] Out: 83 [Urine:40; Blood:10]  BLOOD ADMINISTERED:none  DRAINS: none   LOCAL MEDICATIONS USED:  NONE  SPECIMEN:  Source of Specimen:  endometrial curretings  DISPOSITION OF SPECIMEN:  PATHOLOGY  COUNTS:  YES  TOURNIQUET:  * No tourniquets in log *  DICTATION: .Note written in EPIC  PLAN OF CARE: Discharge to home after PACU  PATIENT DISPOSITION:  PACU - hemodynamically stable.  INDICATIONS: 56 y.o. [id number]  here for scheduled surgery for postmenopausal bleeding and endometrial polyp on biopsy.   Risks of surgery were discussed with the patient including but not limited to: bleeding which may require transfusion; infection which may require antibiotics; injury to uterus or surrounding organs; intrauterine scarring which may impair future fertility; need for additional procedures including laparotomy or laparoscopy; and other postoperative/anesthesia complications. Written informed consent was obtained.    FINDINGS:  A small aneverted uterus.  Normal ostia bilaterally. Polyp at 5 o'clock on posterior uterine wall.  Atrophic endometrium.  COMPLICATIONS:  None immediate.  PROCEDURE DETAILS:  The patient received intravenous antibiotics while in the preoperative area.  She was then taken to the operating room where general anesthesia was administered and was found to be adequate.  After an adequate timeout was performed, she was placed in the dorsal lithotomy position and  examined; then prepped and draped in the sterile manner.   Her bladder was catheterized for an unmeasured amount of clear, yellow urine. A speculum was then placed in the patient's vagina and a single tooth tenaculum was applied to the anterior lip of the cervix.   The cervix was dilated manually with metal dilators to accommodate the 84mm Myosure hysteroscope.  Once the cervix was dilated, the hysteroscope was inserted under direct visualization using saline as a suspension medium.  The uterine cavity was carefully examined, both ostia were recognized.   The Myosure blade was introduced into the uterus under direct visualization.  The polyp was removed without problem.  A gentle sharp curettage was performed after the hysteroscope was removed.  The tenaculum was removed from the anterior lip of the cervix and the vaginal speculum was removed after noting good hemostasis.  The patient tolerated the procedure well and was taken to the recovery area awake, extubated and in stable condition.

## 2016-12-26 NOTE — Transfer of Care (Signed)
Immediate Anesthesia Transfer of Care Note  Patient: Laurie Cooper  Procedure(s) Performed: Procedure(s) with comments: DILATATION & CURETTAGE/HYSTEROSCOPY WITH MYOSURE (N/A) - removal of polyp REP will be here confirmed on 12/23/16  Patient Location: PACU  Anesthesia Type:General  Level of Consciousness: awake, alert  and patient cooperative  Airway & Oxygen Therapy: Patient Spontanous Breathing and Patient connected to nasal cannula oxygen  Post-op Assessment: Report given to RN and Post -op Vital signs reviewed and stable  Post vital signs: Reviewed and stable  Last Vitals:  Vitals:   12/26/16 0823  BP: 106/78  Pulse: 60  Resp: 16  Temp: 36.7 C  SpO2: 100%    Last Pain:  Vitals:   12/26/16 0823  TempSrc: Oral      Patients Stated Pain Goal: 5 (38/88/28 0034)  Complications: No apparent anesthesia complications

## 2016-12-26 NOTE — H&P (Signed)
Laurie Cooper is an 56 y.o. female. With post menopausal bleeding and endometrial polyp on biopsy and [place].      Menstrual History: Patient's last menstrual period was 10/20/2014.    Past Medical History:  Diagnosis Date  . Kidney stone   . Ovarian cyst   . Tennis elbow     Past Surgical History:  Procedure Laterality Date  . ROTATOR CUFF REPAIR     left  . TONSILLECTOMY AND ADENOIDECTOMY      Family History  Problem Relation Age of Onset  . Hodgkin's lymphoma Mother   . Cancer Mother   . Parkinson's disease Father   . Diabetes Maternal Grandmother   . Diabetes Paternal Grandmother   . Ovarian cancer Paternal Aunt     Social History:  reports that she has never smoked. She has never used smokeless tobacco. She reports that she drinks alcohol. She reports that she does not use drugs.  Allergies:  Allergies  Allergen Reactions  . Nsaids Nausea And Vomiting    Prescriptions Prior to Admission  Medication Sig Dispense Refill Last Dose  . Cholecalciferol (VITAMIN D-3 PO) Take 1 tablet by mouth daily.   Past Week at Unknown time  . diclofenac sodium (VOLTAREN) 1 % GEL Apply 2-4 g topically 4 (four) times daily as needed (for pain.).    Past Week at Unknown time  . MAGNESIUM PO Take 1 capsule by mouth daily.   Past Week at Unknown time  . misoprostol (CYTOTEC) 200 MCG tablet Place 2 tablets into vagina the night prior to procedure. 2 tablet 0 12/25/2016 at Unknown time  . Vaginal Lubricant (REPLENS VA) Place 1 application vaginally every three (3) days as needed (for vaginal dryness/discomfort).    Past Month at Unknown time  . acetaminophen (TYLENOL) 325 MG tablet Take 325 mg by mouth every 6 (six) hours as needed (for pain/headaches.).   More than a month at Unknown time    Review of Systems  Constitutional: Negative.   Respiratory: Negative.   Cardiovascular: Negative.   Genitourinary: Negative.   Psychiatric/Behavioral: Negative.     Blood pressure 106/78, pulse  60, temperature 98 F (36.7 C), temperature source Oral, resp. rate 16, height 5\' 6"  (1.676 m), weight 120 lb (54.4 kg), last menstrual period 10/20/2014, SpO2 100 %. Physical Exam  Results for orders placed or performed during the hospital encounter of 12/26/16 (from the past 24 hour(s))  CBC     Status: Abnormal   Collection Time: 12/26/16  8:27 AM  Result Value Ref Range   WBC 5.9 4.0 - 10.5 K/uL   RBC 5.01 3.87 - 5.11 MIL/uL   Hemoglobin 12.2 12.0 - 15.0 g/dL   HCT 38.3 36.0 - 46.0 %   MCV 76.4 (L) 78.0 - 100.0 fL   MCH 24.4 (L) 26.0 - 34.0 pg   MCHC 31.9 30.0 - 36.0 g/dL   RDW 18.8 (H) 11.5 - 15.5 %   Platelets 311 150 - 400 K/uL  Type and screen Bradley     Status: None   Collection Time: 12/26/16  8:27 AM  Result Value Ref Range   ABO/RH(D) O NEG    Antibody Screen NEG    Sample Expiration 12/29/2016     FINDINGS: Uterus  Measurements: 7.5 x 4.1 x 4.1 cm, heterogeneous echotexture. No fibroids or other mass visualized.  Endometrium  Thickness: 10 mm in thickness. 9 mm hypoechoic area within the endometrium concerning for small polyp, similar to prior  study.  Right ovary  Measurements: 2.2 x 1.4 x 1.9 cm. Normal appearance/no adnexal mass.  Left ovary  Measurements: 2.6 x 1.9 x 1.4 cm. Normal appearance/no adnexal mass.  Other findings  No abnormal free fluid.  IMPRESSION: Mildly thickened endometrium with 9 mm hypoechoic area centrally within the endometrium concerning for small polyp  Endometrium, biopsy - BENIGN ENDOMETRIOID TYPE POLYP(S). - BACKGROUND DEGENERATING SECRETORY TYPE ENDOMETRIUM. - THERE IN NO EVIDENCE OF HYPERPLASIA OR MALIGNANCY.  Assessment/Plan: 56 yo female with symptomatic endometrial polyp. Pt consented for D & C, hysteroscopy, and polypectomy.  Risks include but not limited to bleeding, infection, damage to uterus, incompletion of case if perforation occurs, anesthesia risk as consented by Dr.  Ambrose Pancoast.  SCDs placed for DVT prophylaxis.  All questions answered.   [name] 12/26/2016, 9:51 AM

## 2016-12-26 NOTE — Anesthesia Postprocedure Evaluation (Signed)
Anesthesia Post Note  Patient: Lashanna C Sassaman  Procedure(s) Performed: Procedure(s) (LRB): DILATATION & CURETTAGE/HYSTEROSCOPY WITH MYOSURE (N/A)     Patient location during evaluation: PACU Anesthesia Type: General Level of consciousness: awake and alert Pain management: pain level controlled Vital Signs Assessment: post-procedure vital signs reviewed and stable Respiratory status: spontaneous breathing, nonlabored ventilation, respiratory function stable and patient connected to nasal cannula oxygen Cardiovascular status: blood pressure returned to baseline and stable Postop Assessment: no signs of nausea or vomiting Anesthetic complications: no    Last Vitals:  Vitals:   12/26/16 1100 12/26/16 1115  BP: 112/64 112/72  Pulse: 60 (!) 55  Resp: 18 15  Temp:    SpO2: 100% 100%    Last Pain:  Vitals:   12/26/16 0823  TempSrc: Oral   Pain Goal: Patients Stated Pain Goal: 5 (12/26/16 8016)               Naomy Esham

## 2016-12-26 NOTE — Brief Op Note (Signed)
12/26/2016  11:46 AM  PATIENT:  Laurie Cooper  56 y.o. female  PRE-OPERATIVE DIAGNOSIS:  24 to female with postmenopausal bleeding and endometrial polyp on [place] and biopsy  POST-OPERATIVE DIAGNOSIS:  same  PROCEDURE:  Procedure(s) with comments: DILATATION & CURETTAGE/HYSTEROSCOPY WITH MYOSURE (N/A) - removal of polyp REP will be here confirmed on 12/23/16  SURGEON:  Surgeon(s) and Role:    * Guss Bunde, MD - Primary  ASSISTANTS: none   ANESTHESIA:   general  EBL:  Total I/O In: 1400 [I.V.:1400] Out: 79 [Urine:40; Blood:10]  BLOOD ADMINISTERED:none  DRAINS: none   LOCAL MEDICATIONS USED:  NONE  SPECIMEN:  Source of Specimen:  endometrial curretings  DISPOSITION OF SPECIMEN:  PATHOLOGY  COUNTS:  YES  TOURNIQUET:  * No tourniquets in log *  DICTATION: .Note written in EPIC  PLAN OF CARE: Discharge to home after PACU  PATIENT DISPOSITION:  PACU - hemodynamically stable.   Delay start of Pharmacological VTE agent (>24hrs) due to surgical blood loss or risk of bleeding: not applicable

## 2016-12-26 NOTE — Discharge Instructions (Signed)

## 2016-12-27 ENCOUNTER — Encounter (HOSPITAL_COMMUNITY): Payer: Self-pay | Admitting: Obstetrics & Gynecology

## 2017-01-01 ENCOUNTER — Telehealth: Payer: Self-pay | Admitting: *Deleted

## 2017-01-01 NOTE — Telephone Encounter (Signed)
Pt notified of normal pathology and PO appt made with Dr Gala Romney on 01/14/17 @ 11:00

## 2017-01-01 NOTE — Telephone Encounter (Signed)
-----   Message from Guss Bunde, MD sent at 01/01/2017  8:27 AM EDT ----- Benign endometrial polyp.  RN to call.

## 2017-01-14 ENCOUNTER — Encounter: Payer: Self-pay | Admitting: Obstetrics & Gynecology

## 2017-01-14 ENCOUNTER — Ambulatory Visit (INDEPENDENT_AMBULATORY_CARE_PROVIDER_SITE_OTHER): Payer: Managed Care, Other (non HMO) | Admitting: Obstetrics & Gynecology

## 2017-01-14 VITALS — BP 131/82 | HR 71 | Resp 16 | Ht 66.0 in | Wt 127.0 lb

## 2017-01-14 DIAGNOSIS — Z23 Encounter for immunization: Secondary | ICD-10-CM

## 2017-01-14 DIAGNOSIS — Z9889 Other specified postprocedural states: Secondary | ICD-10-CM

## 2017-01-14 NOTE — Progress Notes (Signed)
   Subjective:    Patient ID: Laurie Cooper, female    DOB: February 10, 1961, 56 y.o.   MRN: [id number]  HPI  Pt presents s/p endometrial polyp removal.  Pt has done very well with no pain.  Thee was a couple of days of spotting post procedure.  No bleeding since.  t has been having increased hot flashes.  She is having trouble sleeping.    Review of Systems  Constitutional:       Hot flahses  Gastrointestinal: Negative.   Genitourinary: Negative.        Objective:   Physical Exam  Constitutional: She is oriented to person, place, and time. She appears well-developed and well-nourished. No distress.  HENT:  Head: Normocephalic and atraumatic.  Eyes: Conjunctivae are normal.  Pulmonary/Chest: Effort normal.  Genitourinary:  Genitourinary Comments: Defers exam.  Not having any problems.  Musculoskeletal: She exhibits no edema.  Neurological: She is alert and oriented to person, place, and time.  Skin: Skin is warm and dry.  Psychiatric: She has a normal mood and affect.  Vitals reviewed.   Vitals:   01/14/17 1044  BP: 131/82  Pulse: 71  Resp: 16  Weight: 127 lb (57.6 kg)  Height: 5\' 6"  (1.676 m)         Assessment & Plan:  56 yo female s/p endometrial polypectomy.  Doing well.  No issues.  Hot flashes are not bothersome emough for treatment.  Pt will contact us if she wants medication.s  15 minutes spent face to face with patient with >50% counseling.(Menopuasal symptoms)

## 2017-01-19 ENCOUNTER — Ambulatory Visit: Admit: 2017-01-19 | Payer: Managed Care, Other (non HMO) | Admitting: Obstetrics & Gynecology

## 2017-01-19 SURGERY — DILATATION & CURETTAGE/HYSTEROSCOPY WITH MYOSURE
Anesthesia: Choice

## 2017-01-20 LAB — HM MAMMOGRAPHY

## 2017-07-13 ENCOUNTER — Telehealth: Payer: Self-pay | Admitting: *Deleted

## 2017-07-13 NOTE — Telephone Encounter (Signed)
-----   Message from Guss Bunde, MD sent at 07/13/2017 11:43 AM EDT ----- Laurie Cooper never got her labs drawn last year.  Do you know her (wink wink).  Does she want them reordered?

## 2017-07-13 NOTE — Telephone Encounter (Signed)
Pt states that she forgot about getting her labs drawn.  She states that she will wait until her annual with either Dr Gala Romney or Dr Madilyn Fireman and then have them drawn.  She is busy with her Mom and Dad who are now dependant on her for their appts.  She has not had any further bleeding .

## 2017-07-13 NOTE — Telephone Encounter (Signed)
-----   Message from Guss Bunde, MD sent at 07/13/2017 11:43 AM EDT ----- Koula never got her labs drawn last year.  Do you know her (wink wink).  Does she want them reordered?

## 2017-08-26 ENCOUNTER — Encounter: Payer: Self-pay | Admitting: Family Medicine

## 2017-08-26 ENCOUNTER — Ambulatory Visit (INDEPENDENT_AMBULATORY_CARE_PROVIDER_SITE_OTHER): Payer: Managed Care, Other (non HMO) | Admitting: Family Medicine

## 2017-08-26 VITALS — BP 116/61 | HR 65 | Ht 66.0 in | Wt 123.0 lb

## 2017-08-26 DIAGNOSIS — R5383 Other fatigue: Secondary | ICD-10-CM | POA: Diagnosis not present

## 2017-08-26 DIAGNOSIS — Z0000 Encounter for general adult medical examination without abnormal findings: Secondary | ICD-10-CM | POA: Diagnosis not present

## 2017-08-26 LAB — COMPLETE METABOLIC PANEL WITH GFR
AG Ratio: 2 (calc) (ref 1.0–2.5)
ALT: 11 U/L (ref 6–29)
AST: 13 U/L (ref 10–35)
Albumin: 4.4 g/dL (ref 3.6–5.1)
Alkaline phosphatase (APISO): 66 U/L (ref 33–130)
BUN: 11 mg/dL (ref 7–25)
CALCIUM: 9.6 mg/dL (ref 8.6–10.4)
CO2: 30 mmol/L (ref 20–32)
CREATININE: 0.72 mg/dL (ref 0.50–1.05)
Chloride: 104 mmol/L (ref 98–110)
GFR, EST NON AFRICAN AMERICAN: 92 mL/min/{1.73_m2} (ref >=60)
GFR, Est African American: 106 mL/min/{1.73_m2} (ref >=60)
GLUCOSE: 96 mg/dL (ref 65–99)
Globulin: 2.2 g/dL (calc) (ref 1.9–3.7)
POTASSIUM: 4.2 mmol/L (ref 3.5–5.3)
Sodium: 139 mmol/L (ref 135–146)
Total Bilirubin: 0.6 mg/dL (ref 0.2–1.2)
Total Protein: 6.6 g/dL (ref 6.1–8.1)

## 2017-08-26 LAB — CBC
HCT: 42.8 % (ref 35.0–45.0)
HEMOGLOBIN: 14.4 g/dL (ref 11.7–15.5)
MCH: 29.4 pg (ref 27.0–33.0)
MCHC: 33.6 g/dL (ref 32.0–36.0)
MCV: 87.5 fL (ref 80.0–100.0)
MPV: 10.2 fL (ref 7.5–12.5)
Platelets: 289 10*3/uL (ref 140–400)
RBC: 4.89 10*6/uL (ref 3.80–5.10)
RDW: 13.4 % (ref 11.0–15.0)
WBC: 5.8 10*3/uL (ref 3.8–10.8)

## 2017-08-26 LAB — LIPID PANEL
Cholesterol: 219 mg/dL — ABNORMAL HIGH (ref <200)
HDL: 57 mg/dL (ref >50)
LDL CHOLESTEROL (CALC): 138 mg/dL — AB
NON-HDL CHOLESTEROL (CALC): 162 mg/dL — AB (ref <130)
TRIGLYCERIDES: 118 mg/dL (ref <150)
Total CHOL/HDL Ratio: 3.8 (calc) (ref <5.0)

## 2017-08-26 LAB — IRON: IRON: 138 ug/dL (ref 45–160)

## 2017-08-26 NOTE — Patient Instructions (Signed)

## 2017-08-26 NOTE — Progress Notes (Signed)
Subjective:     Laurie Cooper is a 57 y.o. female and is here for a comprehensive physical exam. The patient reports no problems.  Social History   Socioeconomic History  . Marital status: Married    Spouse name: Not on file  . Number of children: Not on file  . Years of education: Not on file  . Highest education level: Not on file  Occupational History  . Occupation: Product manager: [employer]  Social Needs  . Financial resource strain: Not on file  . Food insecurity:    Worry: Not on file    Inability: Not on file  . Transportation needs:    Medical: Not on file    Non-medical: Not on file  Tobacco Use  . Smoking status: Never Smoker  . Smokeless tobacco: Never Used  Substance and Sexual Activity  . Alcohol use: Yes    Comment: rarely  . Drug use: No  . Sexual activity: Yes    Partners: Male    Birth control/protection: None    Comment: vasectomy  Lifestyle  . Physical activity:    Days per week: Not on file    Minutes per session: Not on file  . Stress: Not on file  Relationships  . Social connections:    Talks on phone: Not on file    Gets together: Not on file    Attends religious service: Not on file    Active member of club or organization: Not on file    Attends meetings of clubs or organizations: Not on file    Relationship status: Not on file  . Intimate partner violence:    Fear of current or ex partner: Not on file    Emotionally abused: Not on file    Physically abused: Not on file    Forced sexual activity: Not on file  Other Topics Concern  . Not on file  Social History Narrative  . Not on file   Health Maintenance  Topic Date Due  . Hepatitis C Screening  16-Apr-1961  . HIV Screening  08/20/1973  . COLONOSCOPY  08/20/2008  . MAMMOGRAM  11/30/2016  . INFLUENZA VACCINE  11/26/2017  . PAP SMEAR  06/26/2019  . TETANUS/TDAP  11/10/2020    The following portions of the patient's history were reviewed and updated as appropriate:  allergies, current medications, past family history, past medical history, past social history, past surgical history and problem list.  Review of Systems A comprehensive review of systems was negative.   Objective:    BP 116/61   Pulse 65   Ht 5\' 6"  (1.676 m)   Wt 123 lb (55.8 kg)   LMP 10/20/2014   SpO2 100%   BMI 19.85 kg/m  General appearance: alert, cooperative and appears stated age Head: Normocephalic, without obvious abnormality, atraumatic Eyes: conj clear, EOMI, PEERLA Ears: normal TM's and external ear canals both ears Nose: Nares normal. Septum midline. Mucosa normal. No drainage or sinus tenderness. Throat: lips, mucosa, and tongue normal; teeth and gums normal Neck: no adenopathy, no carotid bruit, no JVD, supple, symmetrical, trachea midline and thyroid not enlarged, symmetric, no tenderness/mass/nodules Back: symmetric, no curvature. ROM normal. No CVA tenderness. Lungs: clear to auscultation bilaterally Heart: regular rate and rhythm, S1, S2 normal, no murmur, click, rub or gallop Abdomen: soft, non-tender; bowel sounds normal; no masses,  no organomegaly Extremities: extremities normal, atraumatic, no cyanosis or edema Pulses: 2+ and symmetric Skin: Skin color, texture, turgor normal. No  rashes or lesions Lymph nodes: Cervical, supraclavicular, and axillary nodes normal. Neurologic: Alert and oriented X 3, normal strength and tone. Normal symmetric reflexes. Normal coordination and gait    Assessment:    Healthy female exam.      Plan:     See After Visit Summary for Counseling Recommendations   Keep up a regular exercise program and make sure you are eating a healthy diet Try to eat 4 servings of dairy a day, or if you are lactose intolerant take a calcium with vitamin D daily.  Your vaccines are up to date.  H.O on shingles vaccin3 given.  Will get copy of colonoscopy from digestive [facility].  She says she had it done when she turned 67..    Mammogram is  also up-to-date.  Performed at [facility].  Will get entered into the system. For regular labs.

## 2017-08-27 ENCOUNTER — Encounter: Payer: Self-pay | Admitting: Family Medicine

## 2017-08-27 LAB — FERRITIN: Ferritin: 13 ng/mL (ref 10–232)

## 2017-08-27 LAB — B12 AND FOLATE PANEL
FOLATE: 11.9 ng/mL
Vitamin B-12: 268 pg/mL (ref 200–1100)

## 2017-09-01 ENCOUNTER — Encounter: Payer: Self-pay | Admitting: Family Medicine

## 2017-09-14 ENCOUNTER — Encounter: Payer: Self-pay | Admitting: Family Medicine

## 2018-02-15 ENCOUNTER — Encounter: Payer: Self-pay | Admitting: Obstetrics & Gynecology

## 2018-02-15 ENCOUNTER — Ambulatory Visit (INDEPENDENT_AMBULATORY_CARE_PROVIDER_SITE_OTHER): Payer: Managed Care, Other (non HMO) | Admitting: Obstetrics & Gynecology

## 2018-02-15 VITALS — BP 121/84 | HR 65 | Resp 16 | Ht 66.0 in | Wt 126.0 lb

## 2018-02-15 DIAGNOSIS — Z1151 Encounter for screening for human papillomavirus (HPV): Secondary | ICD-10-CM | POA: Diagnosis not present

## 2018-02-15 DIAGNOSIS — Z01419 Encounter for gynecological examination (general) (routine) without abnormal findings: Secondary | ICD-10-CM | POA: Diagnosis not present

## 2018-02-15 NOTE — Progress Notes (Signed)
Subjective:     Laurie Cooper is a 57 y.o. female here for a routine exam.  Current complaints: hot flashes but under control.   Gynecologic History Patient's last menstrual period was 10/20/2014. Contraception: post menopausal status Last Pap: 2018. Results were: normal Last mammogram: 2018. Results were: normal  Obstetric History OB History  Gravida Para Term Preterm AB Living  3 2 2   1 2   SAB TAB Ectopic Multiple Live Births  1            # Outcome Date GA Lbr Len/2nd Weight Sex Delivery Anes PTL Lv  3 SAB           2 Term      Vag-Spont     1 Term      Vag-Spont        The following portions of the patient's history were reviewed and updated as appropriate: allergies, current medications, past family history, past medical history, past social history, past surgical history and problem list.  Review of Systems Pertinent items noted in HPI and remainder of comprehensive ROS otherwise negative.    Objective:      Vitals:   02/15/18 0946  BP: 121/84  Pulse: 65  Resp: 16  Weight: 126 lb (57.2 kg)  Height: 5\' 6"  (1.676 m)   Vitals:  WNL General appearance: alert, cooperative and no distress  HEENT: Normocephalic, without obvious abnormality, atraumatic Eyes: negative Throat: lips, mucosa, and tongue normal; teeth and gums normal  Respiratory: Clear to auscultation bilaterally  CV: Regular rate and rhythm  Breasts:  Normal appearance, no masses or tenderness, no nipple retraction or dimpling  GI: Soft, non-tender; bowel sounds normal; no masses,  no organomegaly  GU: External Genitalia:  Tanner V, no lesion Urethra:  No prolapse   Vagina: Pink, normal rugae, no blood or discharge  Cervix: No CMT, no lesion  Uterus:  Normal size and contour, non tender  Adnexa: Normal, no masses, non tender  Musculoskeletal: No edema, redness or tenderness in the calves or thighs  Skin: No lesions or rash  Lymphatic: Axillary adenopathy: none     Psychiatric: Normal mood and  behavior    Assessment:    Healthy female exam.    Plan:   Annual mammogram Pap and co testing today (history of AGUS) Flu shot up to date Colonoscopy at 62 (nml at 5)

## 2018-02-18 LAB — CYTOLOGY - PAP: HPV (WINDOPATH): NOT DETECTED

## 2018-02-19 ENCOUNTER — Telehealth: Payer: Self-pay | Admitting: *Deleted

## 2018-02-19 NOTE — Telephone Encounter (Signed)
-----   Message from Guss Bunde, MD sent at 02/18/2018  4:50 PM EDT ----- We need to bring Laurie Cooper back in for a rpt pap.  There was some lab error.  Never saw that before.  Obviously no charge. .. .

## 2018-02-19 NOTE — Telephone Encounter (Signed)
-----   Message from Guss Bunde, MD sent at 02/18/2018  4:50 PM EDT ----- We need to bring Madhuri back in for a rpt pap.  There was some lab error.  Never saw that before.  Obviously no charge. .. .

## 2018-02-19 NOTE — Telephone Encounter (Signed)
Pt notified of unsatisfactory pap and need to return for repeat at no charge.

## 2018-02-25 ENCOUNTER — Ambulatory Visit: Payer: Managed Care, Other (non HMO) | Admitting: Obstetrics & Gynecology

## 2018-02-25 ENCOUNTER — Encounter: Payer: Self-pay | Admitting: Obstetrics & Gynecology

## 2018-02-25 VITALS — BP 124/76 | HR 80 | Resp 16 | Ht 66.0 in | Wt 126.0 lb

## 2018-02-25 LAB — HM MAMMOGRAPHY

## 2018-02-26 NOTE — Progress Notes (Signed)
Rescheduled

## 2018-03-04 ENCOUNTER — Ambulatory Visit: Payer: Managed Care, Other (non HMO) | Admitting: Family Medicine

## 2018-03-08 ENCOUNTER — Encounter: Payer: Self-pay | Admitting: Obstetrics & Gynecology

## 2018-03-08 ENCOUNTER — Ambulatory Visit (INDEPENDENT_AMBULATORY_CARE_PROVIDER_SITE_OTHER): Payer: Managed Care, Other (non HMO) | Admitting: Obstetrics & Gynecology

## 2018-03-08 VITALS — BP 120/76 | HR 80 | Ht 66.0 in | Wt 126.0 lb

## 2018-03-08 DIAGNOSIS — Z124 Encounter for screening for malignant neoplasm of cervix: Secondary | ICD-10-CM | POA: Diagnosis not present

## 2018-03-08 DIAGNOSIS — Z01419 Encounter for gynecological examination (general) (routine) without abnormal findings: Secondary | ICD-10-CM

## 2018-03-08 DIAGNOSIS — Z1151 Encounter for screening for human papillomavirus (HPV): Secondary | ICD-10-CM | POA: Diagnosis not present

## 2018-03-08 DIAGNOSIS — N889 Noninflammatory disorder of cervix uteri, unspecified: Secondary | ICD-10-CM

## 2018-03-08 NOTE — Progress Notes (Signed)
Pt here for pap only as first sample was insufficent.  NO charge for today's visit  A speculum was placed in the vagina and a Pap smear was obtained.  The cervix is atrophic and slightly flushed with the vaginal apex.  Repeat Pap smear was taken.

## 2018-03-10 LAB — CYTOLOGY - PAP
Diagnosis: NEGATIVE
HPV (WINDOPATH): NOT DETECTED

## 2018-04-15 ENCOUNTER — Encounter: Payer: Self-pay | Admitting: Family Medicine

## 2018-06-07 ENCOUNTER — Encounter: Payer: Self-pay | Admitting: Family Medicine

## 2018-06-07 ENCOUNTER — Ambulatory Visit (INDEPENDENT_AMBULATORY_CARE_PROVIDER_SITE_OTHER): Payer: Managed Care, Other (non HMO) | Admitting: Family Medicine

## 2018-06-07 ENCOUNTER — Ambulatory Visit (HOSPITAL_BASED_OUTPATIENT_CLINIC_OR_DEPARTMENT_OTHER)
Admission: RE | Admit: 2018-06-07 | Discharge: 2018-06-07 | Disposition: A | Discharge status: Home / Self Care | Payer: Managed Care, Other (non HMO) | Source: Ambulatory Visit | Attending: Family Medicine | Admitting: Family Medicine

## 2018-06-07 VITALS — BP 126/84 | HR 76 | Ht 66.0 in | Wt 123.0 lb

## 2018-06-07 DIAGNOSIS — R142 Eructation: Secondary | ICD-10-CM | POA: Diagnosis not present

## 2018-06-07 DIAGNOSIS — R1011 Right upper quadrant pain: Secondary | ICD-10-CM | POA: Diagnosis not present

## 2018-06-07 DIAGNOSIS — R1013 Epigastric pain: Secondary | ICD-10-CM

## 2018-06-07 MED ORDER — OMEPRAZOLE 40 MG PO CPDR: 40.0000 mg | DELAYED_RELEASE_CAPSULE | Freq: Every day | ORAL | 1 refills | Status: DC

## 2018-06-07 NOTE — Progress Notes (Signed)
Acute Office Visit  Subjective:    Patient ID: Laurie Cooper, female    DOB: [birth year], 58 y.o.   MRN: [id number]  No chief complaint on file.   HPI Patient is in today for epigastric sharp pain below her sternum, worse on her right. Pain is sporadic. EVen went to the ED for It in November. She had a chest pain work up that was negative .  Does lift her granddaughter.  She belches a lot. Denies any GERD.  She says the pain will sometimes radiate into the right upper quadrant and even around into her back.. Says worse when she eats.  Worse today after eating yogurt.  No nausea.  Better with fasting.    Past Medical History:  Diagnosis Date  . Kidney stone   . Ovarian cyst   . Tennis elbow     Past Surgical History:  Procedure Laterality Date  . DILATATION & CURETTAGE/HYSTEROSCOPY WITH MYOSURE N/A 12/26/2016   Procedure: DILATATION & CURETTAGE/HYSTEROSCOPY WITH MYOSURE;  Surgeon: Guss Bunde, MD;  Location: [facility];  Service: Gynecology;  Laterality: N/A;  removal of polyp REP will be here confirmed on 12/23/16  . ROTATOR CUFF REPAIR     left  . TONSILLECTOMY AND ADENOIDECTOMY      Family History  Problem Relation Age of Onset  . Hodgkin's lymphoma Mother   . Cancer Mother   . Parkinson's disease Father   . Heart attack Father   . Diabetes Maternal Grandmother   . Diabetes Paternal Grandmother   . Ovarian cancer Paternal Aunt     Social History   Socioeconomic History  . Marital status: Married    Spouse name: Not on file  . Number of children: Not on file  . Years of education: Not on file  . Highest education level: Not on file  Occupational History  . Occupation: Product manager: [employer]  Social Needs  . Financial resource strain: Not on file  . Food insecurity:    Worry: Not on file    Inability: Not on file  . Transportation needs:    Medical: Not on file    Non-medical: Not on file  Tobacco Use  . Smoking status: Never Smoker  .  Smokeless tobacco: Never Used  Substance and Sexual Activity  . Alcohol use: Yes    Comment: rarely  . Drug use: No  . Sexual activity: Yes    Partners: Male    Birth control/protection: None    Comment: vasectomy  Lifestyle  . Physical activity:    Days per week: Not on file    Minutes per session: Not on file  . Stress: Not on file  Relationships  . Social connections:    Talks on phone: Not on file    Gets together: Not on file    Attends religious service: Not on file    Active member of club or organization: Not on file    Attends meetings of clubs or organizations: Not on file    Relationship status: Not on file  . Intimate partner violence:    Fear of current or ex partner: Not on file    Emotionally abused: Not on file    Physically abused: Not on file    Forced sexual activity: Not on file  Other Topics Concern  . Not on file  Social History Narrative  . Not on file    Outpatient Medications Prior to Visit  Medication Sig Dispense Refill  .  acetaminophen (TYLENOL) 325 MG tablet Take 325 mg by mouth as needed (for pain/headaches.).     Marland Kitchen Cholecalciferol (VITAMIN D-3 PO) Take 1 tablet by mouth daily.    . Ferrous Fumarate (IRON) 18 MG TBCR Take by mouth.    Marland Kitchen MAGNESIUM PO Take 1 capsule by mouth daily.    . Methylcobalamin 5000 MCG TBDP Take by mouth.    . Vaginal Lubricant (REPLENS VA) Place 1 application vaginally every three (3) days as needed (for vaginal dryness/discomfort).      No facility-administered medications prior to visit.     Allergies  Allergen Reactions  . Nsaids Nausea And Vomiting    ROS     Objective:    Physical Exam  Constitutional: She is oriented to person, place, and time. She appears well-developed and well-nourished.  HENT:  Head: Normocephalic and atraumatic.  Cardiovascular: Normal rate, regular rhythm and normal heart sounds.  Pulmonary/Chest: Effort normal and breath sounds normal.  Abdominal: Soft. Bowel sounds are  normal. She exhibits no distension and no mass. There is abdominal tenderness. There is no rebound and no guarding.  + TTP in the epigastric area.   Neurological: She is alert and oriented to person, place, and time.  Skin: Skin is warm and dry.  Psychiatric: She has a normal mood and affect. Her behavior is normal.    BP 126/84   Pulse 76   Ht 5\' 6"  (1.676 m)   Wt 123 lb (55.8 kg)   LMP 10/20/2014   SpO2 99%   BMI 19.85 kg/m  Wt Readings from Last 3 Encounters:  06/07/18 123 lb (55.8 kg)  03/08/18 126 lb (57.2 kg)  02/25/18 126 lb (57.2 kg)    Health Maintenance Due  Topic Date Due  . Hepatitis C Screening  [birth year]  . HIV Screening  08/20/1973    There are no preventive care reminders to display for this patient.   Lab Results  Component Value Date   TSH 0.65 07/01/2016   Lab Results  Component Value Date   WBC 5.8 08/26/2017   HGB 14.4 08/26/2017   HCT 42.8 08/26/2017   MCV 87.5 08/26/2017   PLT 289 08/26/2017   Lab Results  Component Value Date   NA 139 08/26/2017   K 4.2 08/26/2017   CO2 30 08/26/2017   GLUCOSE 96 08/26/2017   BUN 11 08/26/2017   CREATININE 0.72 08/26/2017   BILITOT 0.6 08/26/2017   ALKPHOS 63 07/01/2016   AST 13 08/26/2017   ALT 11 08/26/2017   PROT 6.6 08/26/2017   ALBUMIN 4.1 07/01/2016   CALCIUM 9.6 08/26/2017   Lab Results  Component Value Date   CHOL 219 (H) 08/26/2017   Lab Results  Component Value Date   HDL 57 08/26/2017   Lab Results  Component Value Date   LDLCALC 138 (H) 08/26/2017   Lab Results  Component Value Date   TRIG 118 08/26/2017   Lab Results  Component Value Date   CHOLHDL 3.8 08/26/2017   No results found for: HGBA1C     Assessment & Plan:   Problem List Items Addressed This Visit    None    Visit Diagnoses    Epigastric pain    -  Primary   Relevant Orders   US Abdomen Complete   RUQ pain       Relevant Orders   US Abdomen Complete   Belching       Relevant Orders   US  Abdomen Complete  Will start a PPI for the belching. Consider GB disease vs gastritis vs gastric ulcer.  Will schedule for [place] this evening. Will call with results.    Meds ordered this encounter  Medications  . omeprazole (PRILOSEC) 40 MG capsule    Sig: Take 1 capsule (40 mg total) by mouth daily.    Dispense:  30 capsule    Refill:  1     Beatrice Lecher, MD

## 2018-06-08 ENCOUNTER — Other Ambulatory Visit: Payer: Self-pay | Admitting: *Deleted

## 2018-06-08 DIAGNOSIS — R1011 Right upper quadrant pain: Secondary | ICD-10-CM

## 2018-06-08 DIAGNOSIS — R1013 Epigastric pain: Secondary | ICD-10-CM

## 2018-10-26 ENCOUNTER — Encounter: Payer: Self-pay | Admitting: Family Medicine

## 2018-11-01 ENCOUNTER — Other Ambulatory Visit: Payer: Self-pay

## 2018-11-01 ENCOUNTER — Encounter: Payer: Self-pay | Admitting: Family Medicine

## 2018-11-01 ENCOUNTER — Ambulatory Visit (INDEPENDENT_AMBULATORY_CARE_PROVIDER_SITE_OTHER): Payer: Managed Care, Other (non HMO) | Admitting: Family Medicine

## 2018-11-01 VITALS — BP 113/67 | HR 75 | Ht 66.0 in | Wt 116.0 lb

## 2018-11-01 DIAGNOSIS — R1013 Epigastric pain: Secondary | ICD-10-CM

## 2018-11-01 DIAGNOSIS — R634 Abnormal weight loss: Secondary | ICD-10-CM | POA: Diagnosis not present

## 2018-11-01 DIAGNOSIS — D508 Other iron deficiency anemias: Secondary | ICD-10-CM

## 2018-11-01 DIAGNOSIS — R110 Nausea: Secondary | ICD-10-CM

## 2018-11-01 DIAGNOSIS — E559 Vitamin D deficiency, unspecified: Secondary | ICD-10-CM

## 2018-11-01 NOTE — Progress Notes (Signed)
Established Patient Office Visit  Subjective:  Patient ID: Laurie Cooper, female    DOB: [birth year]  Age: 58 y.o. MRN: [id number]  CC:  Chief Complaint  Patient presents with  . abnormal weight loss    she reports that she eats 1700 calories daily, she has some nausea, and sternum, R rib and back pain at times.not really any changes in he BM's. she questions if this could be pancreatitis    HPI Laurie Cooper presents for discussion about weight loss. She feels like she has had noticeable weight loss over the last 4 months. Thinks it is about 13 lbs.  She has purposefully trying to eat 3 meals a day with 2 snacks to make sure she is getting in enough calories.  She reports that she eats 1700 calories daily. She has had chronic nausea.  Has been there for months.  Has one stones seen on [place] in Feb. We had referred her to GI but they didn't see her at that time.  Occ with have some epigastric pain that radiates into her back back pain at times.No changes in he BM's. She questions if this could be pancreatitis.  She is up-to-date on her Pap smear and mammogram but is due for repeat colonoscopy this year. No fever or night sweats. No unusually lumps or rash. No swollen LNs. No blood in the urine or stool today.  Has tried PPi with no improvement. She is no longer on PPI.    No fam hx of Crohns or Celiac.    Hx of iron def - still taking iron every other day.    Hx of Vit D def.    Past Medical History:  Diagnosis Date  . Kidney stone   . Ovarian cyst   . Tennis elbow     Past Surgical History:  Procedure Laterality Date  . DILATATION & CURETTAGE/HYSTEROSCOPY WITH MYOSURE N/A 12/26/2016   Procedure: DILATATION & CURETTAGE/HYSTEROSCOPY WITH MYOSURE;  Surgeon: Guss Bunde, MD;  Location: [facility];  Service: Gynecology;  Laterality: N/A;  removal of polyp REP will be here confirmed on 12/23/16  . ROTATOR CUFF REPAIR     left  . TONSILLECTOMY AND ADENOIDECTOMY      Family History   Problem Relation Age of Onset  . Hodgkin's lymphoma Mother   . Cancer Mother   . Parkinson's disease Father   . Heart attack Father   . Diabetes Maternal Grandmother   . Diabetes Paternal Grandmother   . Ovarian cancer Paternal Aunt     Social History   Socioeconomic History  . Marital status: Married    Spouse name: Not on file  . Number of children: Not on file  . Years of education: Not on file  . Highest education level: Not on file  Occupational History  . Occupation: Product manager: [employer]  Social Needs  . Financial resource strain: Not on file  . Food insecurity    Worry: Not on file    Inability: Not on file  . Transportation needs    Medical: Not on file    Non-medical: Not on file  Tobacco Use  . Smoking status: Never Smoker  . Smokeless tobacco: Never Used  Substance and Sexual Activity  . Alcohol use: Yes    Comment: rarely  . Drug use: No  . Sexual activity: Yes    Partners: Male    Birth control/protection: None    Comment: vasectomy  Lifestyle  .  Physical activity    Days per week: Not on file    Minutes per session: Not on file  . Stress: Not on file  Relationships  . Social Herbalist on phone: Not on file    Gets together: Not on file    Attends religious service: Not on file    Active member of club or organization: Not on file    Attends meetings of clubs or organizations: Not on file    Relationship status: Not on file  . Intimate partner violence    Fear of current or ex partner: Not on file    Emotionally abused: Not on file    Physically abused: Not on file    Forced sexual activity: Not on file  Other Topics Concern  . Not on file  Social History Narrative  . Not on file    Outpatient Medications Prior to Visit  Medication Sig Dispense Refill  . acetaminophen (TYLENOL) 325 MG tablet Take 325 mg by mouth as needed (for pain/headaches.).     Marland Kitchen Cholecalciferol (VITAMIN D-3 PO) Take 1 tablet by mouth daily.     . Ferrous Fumarate (IRON) 18 MG TBCR Take by mouth.    Marland Kitchen MAGNESIUM PO Take 1 capsule by mouth daily.    . Methylcobalamin 5000 MCG TBDP Take by mouth.    . Vaginal Lubricant (REPLENS VA) Place 1 application vaginally every three (3) days as needed (for vaginal dryness/discomfort).     Marland Kitchen omeprazole (PRILOSEC) 40 MG capsule Take 1 capsule (40 mg total) by mouth daily. 30 capsule 1   No facility-administered medications prior to visit.     Allergies  Allergen Reactions  . Nsaids Nausea And Vomiting    ROS Review of Systems    Objective:    Physical Exam  Constitutional: She is oriented to person, place, and time. She appears well-developed and well-nourished.  HENT:  Head: Normocephalic and atraumatic.  Right Ear: External ear normal.  Left Ear: External ear normal.  Nose: Nose normal.  Mouth/Throat: Oropharynx is clear and moist.  TMs and canals are clear.   Eyes: Pupils are equal, round, and reactive to light. Conjunctivae and EOM are normal.  Neck: Neck supple. No thyromegaly present.  Cardiovascular: Normal rate, regular rhythm and normal heart sounds.  Pulmonary/Chest: Effort normal and breath sounds normal. She has no wheezes.  Abdominal: Soft. Bowel sounds are normal. She exhibits no distension and no mass. There is abdominal tenderness. There is no rebound and no guarding.  Tenderness in the epigastric area.   Lymphadenopathy:    She has no cervical adenopathy.  Neurological: She is alert and oriented to person, place, and time.  Skin: Skin is warm and dry.  Psychiatric: She has a normal mood and affect. Her behavior is normal.    Ht 5\' 6"  (1.676 m)   LMP 10/20/2014   BMI 19.85 kg/m  Wt Readings from Last 3 Encounters:  06/07/18 123 lb (55.8 kg)  03/08/18 126 lb (57.2 kg)  02/25/18 126 lb (57.2 kg)     Health Maintenance Due  Topic Date Due  . Hepatitis C Screening  09/17/58  . HIV Screening  08/20/1973    There are no preventive care reminders to  display for this patient.  Lab Results  Component Value Date   TSH 0.65 07/01/2016   Lab Results  Component Value Date   WBC 5.8 08/26/2017   HGB 14.4 08/26/2017   HCT 42.8 08/26/2017  MCV 87.5 08/26/2017   PLT 289 08/26/2017   Lab Results  Component Value Date   NA 139 08/26/2017   K 4.2 08/26/2017   CO2 30 08/26/2017   GLUCOSE 96 08/26/2017   BUN 11 08/26/2017   CREATININE 0.72 08/26/2017   BILITOT 0.6 08/26/2017   ALKPHOS 63 07/01/2016   AST 13 08/26/2017   ALT 11 08/26/2017   PROT 6.6 08/26/2017   ALBUMIN 4.1 07/01/2016   CALCIUM 9.6 08/26/2017   Lab Results  Component Value Date   CHOL 219 (H) 08/26/2017   Lab Results  Component Value Date   HDL 57 08/26/2017   Lab Results  Component Value Date   LDLCALC 138 (H) 08/26/2017   Lab Results  Component Value Date   TRIG 118 08/26/2017   Lab Results  Component Value Date   CHOLHDL 3.8 08/26/2017   No results found for: HGBA1C    Assessment & Plan:   Problem List Items Addressed This Visit      Other   Vitamin D deficiency   ANEMIA, IRON DEFICIENCY    Other Visit Diagnoses    Abnormal weight loss    -  Primary   Relevant Orders   COMPLETE METABOLIC PANEL WITH GFR   Lipid panel   CBC with Differential/Platelet   Celiac Disease Panel   TSH + free T4   Lipase   Amylase   Sedimentation rate   C-reactive protein   Ambulatory referral to Gastroenterology   Nausea       Relevant Orders   COMPLETE METABOLIC PANEL WITH GFR   Lipid panel   CBC with Differential/Platelet   Celiac Disease Panel   TSH + free T4   Lipase   Amylase   Sedimentation rate   C-reactive protein   Ambulatory referral to Gastroenterology   Epigastric pain       Relevant Orders   COMPLETE METABOLIC PANEL WITH GFR   Lipid panel   CBC with Differential/Platelet   Celiac Disease Panel   TSH + free T4   Lipase   Amylase   Sedimentation rate   C-reactive protein   Ambulatory referral to Gastroenterology      Abnormal weight loss with chronic nausea and occ epigastric pain.  Will check labs for thyroid d/o, infection, inflammation and check pancreas and liver.  Will refer back to GI.  Continue to work on consistent caloric intake.  Cancer screening up to date except colonoscopy. Due for repeat screening.    No orders of the defined types were placed in this encounter.   Follow-up: Return if symptoms worsen or fail to improve.    Beatrice Lecher, MD

## 2018-11-02 LAB — CELIAC DISEASE PANEL
(tTG) Ab, IgA: 1 U/mL
(tTG) Ab, IgG: 2 U/mL
Gliadin IgA: 8 Units
Gliadin IgG: 3 Units
Immunoglobulin A: 158 mg/dL (ref 47–310)

## 2018-11-02 LAB — COMPLETE METABOLIC PANEL WITH GFR
AG Ratio: 2.1 (calc) (ref 1.0–2.5)
ALT: 10 U/L (ref 6–29)
AST: 10 U/L (ref 10–35)
Albumin: 4.5 g/dL (ref 3.6–5.1)
Alkaline phosphatase (APISO): 64 U/L (ref 37–153)
BUN: 12 mg/dL (ref 7–25)
CO2: 29 mmol/L (ref 20–32)
Calcium: 9.6 mg/dL (ref 8.6–10.4)
Chloride: 104 mmol/L (ref 98–110)
Creat: 0.64 mg/dL (ref 0.50–0.99)
GFR, Est African American: 112 mL/min/{1.73_m2} (ref >=60)
GFR, Est Non African American: 97 mL/min/{1.73_m2} (ref >=60)
Globulin: 2.1 g/dL (calc) (ref 1.9–3.7)
Glucose, Bld: 98 mg/dL (ref 65–99)
Potassium: 4.6 mmol/L (ref 3.5–5.3)
Sodium: 140 mmol/L (ref 135–146)
Total Bilirubin: 0.5 mg/dL (ref 0.2–1.2)
Total Protein: 6.6 g/dL (ref 6.1–8.1)

## 2018-11-02 LAB — CBC WITH DIFFERENTIAL/PLATELET
Absolute Monocytes: 519 cells/uL (ref 200–950)
Basophils Absolute: 59 cells/uL (ref 0–200)
Basophils Relative: 1 %
Eosinophils Absolute: 30 cells/uL (ref 15–500)
Eosinophils Relative: 0.5 %
HCT: 45.4 % — ABNORMAL HIGH (ref 35.0–45.0)
Hemoglobin: 15.2 g/dL (ref 11.7–15.5)
Lymphs Abs: 1906 cells/uL (ref 850–3900)
MCH: 31.3 pg (ref 27.0–33.0)
MCHC: 33.5 g/dL (ref 32.0–36.0)
MCV: 93.4 fL (ref 80.0–100.0)
MPV: 10.4 fL (ref 7.5–12.5)
Monocytes Relative: 8.8 %
Neutro Abs: 3387 cells/uL (ref 1500–7800)
Neutrophils Relative %: 57.4 %
Platelets: 295 10*3/uL (ref 140–400)
RBC: 4.86 10*6/uL (ref 3.80–5.10)
RDW: 12.4 % (ref 11.0–15.0)
Total Lymphocyte: 32.3 %
WBC: 5.9 10*3/uL (ref 3.8–10.8)

## 2018-11-02 LAB — SEDIMENTATION RATE: Sed Rate: 2 mm/h (ref 0–30)

## 2018-11-02 LAB — AMYLASE: Amylase: 83 U/L (ref 21–101)

## 2018-11-02 LAB — LIPID PANEL
Cholesterol: 221 mg/dL — ABNORMAL HIGH (ref <200)
HDL: 60 mg/dL (ref >=50)
LDL Cholesterol (Calc): 136 mg/dL (calc) — ABNORMAL HIGH
Non-HDL Cholesterol (Calc): 161 mg/dL (calc) — ABNORMAL HIGH (ref <130)
Total CHOL/HDL Ratio: 3.7 (calc) (ref <5.0)
Triglycerides: 127 mg/dL (ref <150)

## 2018-11-02 LAB — TSH+FREE T4: TSH W/REFLEX TO FT4: 0.44 mIU/L (ref 0.40–4.50)

## 2018-11-02 LAB — C-REACTIVE PROTEIN: CRP: <0.2 mg/L (ref <8.0)

## 2018-11-02 LAB — LIPASE: Lipase: 15 U/L (ref 7–60)

## 2018-11-08 ENCOUNTER — Encounter: Payer: Self-pay | Admitting: Family Medicine

## 2018-11-09 ENCOUNTER — Telehealth: Payer: Self-pay | Admitting: Family Medicine

## 2018-11-09 NOTE — Telephone Encounter (Signed)
Received a fax from [facility] that patient was needing a refill of Omeprazole. I spoke with [place] and she states that the patient reported not taking this medication at the last visit. I called the patient and she reports that GI has prescribed the medication and it was not covered by insurance. She picked up 30 days OTC and will follow up with GI to see if she will need the medication. I faxed a note back to the pharmacy that the patient did not want the prescription at this time.

## 2018-12-27 ENCOUNTER — Ambulatory Visit: Payer: Managed Care, Other (non HMO) | Admitting: Obstetrics & Gynecology

## 2019-02-07 ENCOUNTER — Ambulatory Visit (INDEPENDENT_AMBULATORY_CARE_PROVIDER_SITE_OTHER): Payer: Managed Care, Other (non HMO) | Admitting: Family Medicine

## 2019-02-07 ENCOUNTER — Other Ambulatory Visit: Payer: Self-pay

## 2019-02-07 DIAGNOSIS — Z23 Encounter for immunization: Secondary | ICD-10-CM

## 2019-03-21 ENCOUNTER — Ambulatory Visit: Payer: Managed Care, Other (non HMO) | Admitting: Obstetrics & Gynecology

## 2019-03-30 ENCOUNTER — Encounter: Payer: Self-pay | Admitting: *Deleted

## 2019-04-19 ENCOUNTER — Encounter: Payer: Self-pay | Admitting: Family Medicine

## 2019-04-26 ENCOUNTER — Other Ambulatory Visit: Payer: Self-pay

## 2019-04-26 ENCOUNTER — Encounter: Payer: Self-pay | Admitting: Sports Medicine

## 2019-04-26 ENCOUNTER — Ambulatory Visit (INDEPENDENT_AMBULATORY_CARE_PROVIDER_SITE_OTHER): Payer: Managed Care, Other (non HMO) | Admitting: Sports Medicine

## 2019-04-26 ENCOUNTER — Ambulatory Visit (INDEPENDENT_AMBULATORY_CARE_PROVIDER_SITE_OTHER): Payer: Managed Care, Other (non HMO)

## 2019-04-26 DIAGNOSIS — M533 Sacrococcygeal disorders, not elsewhere classified: Secondary | ICD-10-CM

## 2019-04-26 MED ORDER — PANTOPRAZOLE SODIUM 40 MG PO TBEC: 40.0000 mg | DELAYED_RELEASE_TABLET | Freq: Every day | ORAL | 3 refills | Status: DC

## 2019-04-26 MED ORDER — MELOXICAM 15 MG PO TABS: ORAL_TABLET | ORAL | 3 refills | Status: DC

## 2019-04-26 NOTE — Progress Notes (Signed)
Subjective:    CC: Tailbone pain  HPI: This is a pleasant 58 year old female, for the past several months she has had pain at the tip of her tailbone, no trauma, nothing overtly radicular, no constitutional symptoms, pain is localized without radiation, moderate.  I reviewed the past medical history, family history, social history, surgical history, and allergies today and no changes were needed.  Please see the problem list section below in epic for further details.  Past Medical History: Past Medical History:  Diagnosis Date  . Kidney stone   . Ovarian cyst   . Tennis elbow    Past Surgical History: Past Surgical History:  Procedure Laterality Date  . DILATATION & CURETTAGE/HYSTEROSCOPY WITH MYOSURE N/A 12/26/2016   Procedure: DILATATION & CURETTAGE/HYSTEROSCOPY WITH MYOSURE;  Surgeon: Guss Bunde, MD;  Location: [facility];  Service: Gynecology;  Laterality: N/A;  removal of polyp REP will be here confirmed on 12/23/16  . ROTATOR CUFF REPAIR     left  . TONSILLECTOMY AND ADENOIDECTOMY     Social History: Social History   Socioeconomic History  . Marital status: Married    Spouse name: Not on file  . Number of children: Not on file  . Years of education: Not on file  . Highest education level: Not on file  Occupational History  . Occupation: Product manager: [employer]  Tobacco Use  . Smoking status: Never Smoker  . Smokeless tobacco: Never Used  Substance and Sexual Activity  . Alcohol use: Yes    Comment: rarely  . Drug use: No  . Sexual activity: Yes    Partners: Male    Birth control/protection: None    Comment: vasectomy  Other Topics Concern  . Not on file  Social History Narrative  . Not on file   Social Determinants of Health   Financial Resource Strain:   . Difficulty of Paying Living Expenses: Not on file  Food Insecurity:   . Worried About Charity fundraiser in the Last Year: Not on file  . Ran Out of Food in the Last Year: Not on file    Transportation Needs:   . Lack of Transportation (Medical): Not on file  . Lack of Transportation (Non-Medical): Not on file  Physical Activity:   . Days of Exercise per Week: Not on file  . Minutes of Exercise per Session: Not on file  Stress:   . Feeling of Stress : Not on file  Social Connections:   . Frequency of Communication with Friends and Family: Not on file  . Frequency of Social Gatherings with Friends and Family: Not on file  . Attends Religious Services: Not on file  . Active Member of Clubs or Organizations: Not on file  . Attends Archivist Meetings: Not on file  . Marital Status: Not on file   Family History: Family History  Problem Relation Age of Onset  . Hodgkin's lymphoma Mother   . Cancer Mother   . Parkinson's disease Father   . Heart attack Father   . Diabetes Maternal Grandmother   . Diabetes Paternal Grandmother   . Ovarian cancer Paternal Aunt    Allergies: Allergies  Allergen Reactions  . Nsaids Nausea And Vomiting   Medications: See med rec.  Review of Systems: No fevers, chills, night sweats, weight loss, chest pain, or shortness of breath.   Objective:    General: Well Developed, well nourished, and in no acute distress.  Neuro: Alert and oriented x3,  extra-ocular muscles intact, sensation grossly intact.  HEENT: Normocephalic, atraumatic, pupils equal round reactive to light, neck supple, no masses, no lymphadenopathy, thyroid nonpalpable.  Skin: Warm and dry, no rashes. Cardiac: Regular rate and rhythm, no murmurs rubs or gallops, no lower extremity edema.  Respiratory: Clear to auscultation bilaterally. Not using accessory muscles, speaking in full sentences. Low back: Tender to palpation directly over the tip of the coccyx.  No tenderness over the lumbar spinous processes or paralumbar muscles.  Impression and Recommendations:    Coccydynia Nontraumatic coccydynia, starting with a donut pillow, meloxicam,  Protonix. X-rays. Return to see me in a month, MRI versus intercoccygeal disc injection if no better.   ___________________________________________ Gwen Her. Dianah Field, M.D., ABFM., CAQSM. Primary Care and Sports Medicine  [facility]  Adjunct Professor of [employer] [facility] [employer]

## 2019-04-26 NOTE — Patient Instructions (Signed)
Tailbone Injury  The tailbone (coccyx) is the small bone at the lower end of the spine. A tailbone injury may involve stretched ligaments, bruising, or a broken bone (fracture). Tailbone injuries can be painful, and some may take a long time to heal. What are the causes? This condition may be caused by:  Falling and landing on the tailbone.  Repeated strain or friction from sitting for long periods of time. This may include actions such as rowing and bicycling.  Childbirth. In some cases, the cause may not be known. What are the signs or symptoms? Symptoms of this condition include:  Pain in the tailbone area or lower back, especially when sitting.  Pain or difficulty when standing up from a sitting position.  Bruising or swelling in the tailbone area.  Painful bowel movements.  In women, pain during intercourse. How is this diagnosed? This condition may be diagnosed based on:  Your symptoms.  A physical exam. If your health care provider suspects a fracture, you may have additional tests, such as:  X-rays.  CT scan.  MRI. How is this treated? Most tailbone injuries heal on their own in 4-6 weeks. However, recovery time may be longer if the injury involves a fracture. Treatment for this condition may include:  NSAIDs or other over-the-counter medicines to help relieve your pain.  Using a large, rubber or inflated ring or cushion to take pressure off the tailbone when sitting.  Physical therapy.  Injecting the tailbone area with local anesthesia and steroid medicine. This is not normally needed unless the pain does not improve over time with over-the-counter pain medicines. Follow these instructions at home: Activity  Avoid sitting for long periods of time.  To prevent repeating an injury that is caused by strain or friction: ? Wear appropriate padding and sports gear when bicycling and rowing.  Increase your activity as the pain allows. Perform any exercises  that are recommended by your health care provider or physical therapist. Managing pain, stiffness, and swelling  To help decrease discomfort when sitting: ? Sit on your rubber or inflated ring or cushion as told by your health care provider. ? Lean forward when you sit.  If directed, apply ice to the injured area: ? Put ice in a plastic bag. ? Place a towel between your skin and the bag. ? Leave the ice on for 20 minutes, 2-3 times per day for the first 1-2 days.  If directed, apply heat to the affected area as often as told by your health care provider. Use the heat source that your health care provider recommends, such as a moist heat pack or a heating pad. ? Place a towel between your skin and the heat source. ? Leave the heat on for 20-30 minutes. ? Remove the heat if your skin turns bright red. This is especially important if you are unable to feel pain, heat, or cold. You may have a greater risk of getting burned. General instructions  Take over-the-counter and prescription medicines only as told by your health care provider.  To prevent or treat constipation or painful bowel movements, your health care provider may recommend that you: ? Drink enough fluid to keep your urine pale yellow. ? Eat foods that are high in fiber, such as fresh fruits and vegetables, whole grains, and beans. ? Limit foods that are high in fat and processed sugars, such as fried and sweet foods. ? Take an over-the-counter or prescription medicine for constipation.  Keep all follow-up visits as   directed by your health care provider. This is important. Contact a health care provider if:  Your pain becomes worse or is not controlled with medicine.  Your bowel movements cause a great deal of discomfort.  You are unable to have a bowel movement after 4 days.  You have pain during intercourse. Summary  A tailbone injury may involve stretched ligaments, bruising, or a broken bone (fracture).  Tailbone  injuries can be painful. Most heal on their own in 4-6 weeks.  Treatment may include taking NSAIDs, using a rubber or inflated ring or cushion when sitting, and physical therapy.  Follow any recommendations from your health care provider to prevent or treat constipation. This information is not intended to replace advice given to you by your health care provider. Make sure you discuss any questions you have with your health care provider. Document Released: 04/11/2000 Document Revised: 05/12/2017 Document Reviewed: 05/12/2017 Elsevier Patient Education  2020 [employer].

## 2019-04-26 NOTE — Assessment & Plan Note (Signed)
Nontraumatic coccydynia, starting with a donut pillow, meloxicam, Protonix. X-rays. Return to see me in a month, MRI versus intercoccygeal disc injection if no better.

## 2019-05-02 ENCOUNTER — Ambulatory Visit (INDEPENDENT_AMBULATORY_CARE_PROVIDER_SITE_OTHER): Payer: Managed Care, Other (non HMO) | Admitting: Obstetrics & Gynecology

## 2019-05-02 ENCOUNTER — Encounter: Payer: Self-pay | Admitting: Obstetrics & Gynecology

## 2019-05-02 ENCOUNTER — Other Ambulatory Visit: Payer: Self-pay

## 2019-05-02 VITALS — BP 116/84 | HR 84 | Temp 98.5°F | Resp 16 | Ht 66.0 in | Wt 120.0 lb

## 2019-05-02 DIAGNOSIS — Z124 Encounter for screening for malignant neoplasm of cervix: Secondary | ICD-10-CM | POA: Diagnosis not present

## 2019-05-02 DIAGNOSIS — Z1151 Encounter for screening for human papillomavirus (HPV): Secondary | ICD-10-CM | POA: Diagnosis not present

## 2019-05-02 DIAGNOSIS — R87619 Unspecified abnormal cytological findings in specimens from cervix uteri: Secondary | ICD-10-CM

## 2019-05-02 DIAGNOSIS — Z01419 Encounter for gynecological examination (general) (routine) without abnormal findings: Secondary | ICD-10-CM

## 2019-05-02 NOTE — Addendum Note (Signed)
Addended by: Asencion Islam on: 05/02/2019 11:43 AM   Modules accepted: Orders

## 2019-05-02 NOTE — Progress Notes (Signed)
Subjective:     Laurie Cooper is a 59 y.o. female here for a routine exam.  Current complaints: none.  Pt lost 15 pounds and did not figure out why.  Pt thinks gallbaldder.  Pt watching diet and doing yoga.  Pt has nml mammogram   Gynecologic History Patient's last menstrual period was 10/20/2014. Contraception: post menopausal status Last Pap: 2019. Results were: normal Last mammogram: 2019. Results were: normal  Obstetric History OB History  Gravida Para Term Preterm AB Living  3 2 2   1 2   SAB TAB Ectopic Multiple Live Births  1            # Outcome Date GA Lbr Len/2nd Weight Sex Delivery Anes PTL Lv  3 SAB           2 Term      Vag-Spont     1 Term      Vag-Spont        The following portions of the patient's history were reviewed and updated as appropriate: allergies, current medications, past family history, past medical history, past social history, past surgical history and problem list.  Review of Systems Pertinent items noted in HPI and remainder of comprehensive ROS otherwise negative.    Objective:      Vitals:   05/02/19 1020  BP: 116/84  Pulse: 84  Resp: 16  Temp: 98.5 F (36.9 C)  Weight: 120 lb (54.4 kg)  Height: 5\' 6"  (1.676 m)   Vitals:  WNL General appearance: alert, cooperative and no distress  HEENT: Normocephalic, without obvious abnormality, atraumatic Eyes: negative Throat: lips, mucosa, and tongue normal; teeth and gums normal  Respiratory: Clear to auscultation bilaterally  CV: Regular rate and rhythm  Breasts:  Normal appearance, no masses or tenderness, no nipple retraction or dimpling  GI: Soft, non-tender; bowel sounds normal; no masses,  no organomegaly  GU: External Genitalia:  Tanner V, no lesion Urethra:  No prolapse   Vagina: Pale pink, atrophic, no discharge  Cervix: No CMT, no lesion  Uterus:  Normal size and contour, non tender  Adnexa: Normal, no masses, non tender  Musculoskeletal: No edema, redness or tenderness in  the calves or thighs  Skin: No lesions or rash  Lymphatic: Axillary adenopathy: none     Psychiatric: Normal mood and behavior        Assessment:    Healthy female exam.   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 Continue Replens Continue vitamin D; gets calcium through diet

## 2019-05-03 LAB — CYTOLOGY - PAP
Comment: NEGATIVE
Diagnosis: NEGATIVE
High risk HPV: NEGATIVE

## 2019-05-24 ENCOUNTER — Ambulatory Visit: Payer: Managed Care, Other (non HMO) | Admitting: Sports Medicine

## 2019-05-31 ENCOUNTER — Ambulatory Visit: Payer: Managed Care, Other (non HMO) | Admitting: Sports Medicine

## 2019-08-23 LAB — HM COLONOSCOPY

## 2019-08-31 ENCOUNTER — Telehealth: Payer: Self-pay | Admitting: Family Medicine

## 2019-08-31 DIAGNOSIS — Z0000 Encounter for general adult medical examination without abnormal findings: Secondary | ICD-10-CM

## 2019-08-31 NOTE — Telephone Encounter (Signed)
Labs entered.

## 2019-08-31 NOTE — Telephone Encounter (Signed)
Left a message advising of labs.

## 2019-08-31 NOTE — Addendum Note (Signed)
Addended by: Beatrice Lecher D on: 08/31/2019 02:43 PM   Modules accepted: Orders

## 2019-08-31 NOTE — Telephone Encounter (Signed)
PT requested bloodwork to be put in before appointment.  Physical on 5/21  PT

## 2019-08-31 NOTE — Telephone Encounter (Signed)
It has not been a year since last fasting labs. Please advise what labs you will need for the physical. Please advise.

## 2019-09-02 ENCOUNTER — Encounter: Payer: Managed Care, Other (non HMO) | Admitting: Family Medicine

## 2019-09-12 LAB — COMPLETE METABOLIC PANEL WITH GFR
AG Ratio: 2.4 (calc) (ref 1.0–2.5)
ALT: 14 U/L (ref 6–29)
AST: 11 U/L (ref 10–35)
Albumin: 4.3 g/dL (ref 3.6–5.1)
Alkaline phosphatase (APISO): 70 U/L (ref 37–153)
BUN: 12 mg/dL (ref 7–25)
CO2: 31 mmol/L (ref 20–32)
Calcium: 9.4 mg/dL (ref 8.6–10.4)
Chloride: 104 mmol/L (ref 98–110)
Creat: 0.68 mg/dL (ref 0.50–0.99)
GFR, Est African American: 109 mL/min/{1.73_m2} (ref >=60)
GFR, Est Non African American: 94 mL/min/{1.73_m2} (ref >=60)
Globulin: 1.8 g/dL (calc) — ABNORMAL LOW (ref 1.9–3.7)
Glucose, Bld: 98 mg/dL (ref 65–99)
Potassium: 4.7 mmol/L (ref 3.5–5.3)
Sodium: 139 mmol/L (ref 135–146)
Total Bilirubin: 0.5 mg/dL (ref 0.2–1.2)
Total Protein: 6.1 g/dL (ref 6.1–8.1)

## 2019-09-12 LAB — CBC
HCT: 43.1 % (ref 35.0–45.0)
Hemoglobin: 14.4 g/dL (ref 11.7–15.5)
MCH: 30.8 pg (ref 27.0–33.0)
MCHC: 33.4 g/dL (ref 32.0–36.0)
MCV: 92.1 fL (ref 80.0–100.0)
MPV: 10.1 fL (ref 7.5–12.5)
Platelets: 259 10*3/uL (ref 140–400)
RBC: 4.68 10*6/uL (ref 3.80–5.10)
RDW: 11.9 % (ref 11.0–15.0)
WBC: 5.8 10*3/uL (ref 3.8–10.8)

## 2019-09-12 LAB — LIPID PANEL
Cholesterol: 238 mg/dL — ABNORMAL HIGH (ref <200)
HDL: 63 mg/dL (ref >=50)
LDL Cholesterol (Calc): 153 mg/dL (calc) — ABNORMAL HIGH
Non-HDL Cholesterol (Calc): 175 mg/dL (calc) — ABNORMAL HIGH (ref <130)
Total CHOL/HDL Ratio: 3.8 (calc) (ref <5.0)
Triglycerides: 107 mg/dL (ref <150)

## 2019-09-16 ENCOUNTER — Ambulatory Visit (INDEPENDENT_AMBULATORY_CARE_PROVIDER_SITE_OTHER): Payer: Managed Care, Other (non HMO) | Admitting: Family Medicine

## 2019-09-16 ENCOUNTER — Other Ambulatory Visit: Payer: Self-pay

## 2019-09-16 ENCOUNTER — Encounter: Payer: Self-pay | Admitting: Family Medicine

## 2019-09-16 VITALS — BP 112/73 | HR 70 | Ht 66.0 in | Wt 118.0 lb

## 2019-09-16 DIAGNOSIS — Z0000 Encounter for general adult medical examination without abnormal findings: Secondary | ICD-10-CM | POA: Diagnosis not present

## 2019-09-16 NOTE — Progress Notes (Addendum)
Subjective:     Laurie Cooper is a 59 y.o. female and is here for a comprehensive physical exam. The patient reports no problems.  Social History   Socioeconomic History  . Marital status: Married    Spouse name: Not on file  . Number of children: Not on file  . Years of education: Not on file  . Highest education level: Not on file  Occupational History  . Occupation: Product manager: [employer]  Tobacco Use  . Smoking status: Never Smoker  . Smokeless tobacco: Never Used  Substance and Sexual Activity  . Alcohol use: Yes    Comment: rarely  . Drug use: No  . Sexual activity: Yes    Partners: Male    Birth control/protection: None    Comment: vasectomy  Other Topics Concern  . Not on file  Social History Narrative  . Not on file   Social Determinants of Health   Financial Resource Strain:   . Difficulty of Paying Living Expenses:   Food Insecurity:   . Worried About Charity fundraiser in the Last Year:   . Arboriculturist in the Last Year:   Transportation Needs:   . Film/video editor (Medical):   Marland Kitchen Lack of Transportation (Non-Medical):   Physical Activity:   . Days of Exercise per Week:   . Minutes of Exercise per Session:   Stress:   . Feeling of Stress :   Social Connections:   . Frequency of Communication with Friends and Family:   . Frequency of Social Gatherings with Friends and Family:   . Attends Religious Services:   . Active Member of Clubs or Organizations:   . Attends Archivist Meetings:   Marland Kitchen Marital Status:   Intimate Partner Violence:   . Fear of Current or Ex-Partner:   . Emotionally Abused:   Marland Kitchen Physically Abused:   . Sexually Abused:    Health Maintenance  Topic Date Due  . Hepatitis C Screening  Never done  . HIV Screening  Never done  . INFLUENZA VACCINE  11/27/2019  . MAMMOGRAM  02/26/2020  . COLONOSCOPY  05/21/2020  . TETANUS/TDAP  11/10/2020  . PAP SMEAR-Modifier  05/01/2022  . COVID-19 Vaccine  Completed     The following portions of the patient's history were reviewed and updated as appropriate: allergies, current medications, past family history, past medical history, past social history, past surgical history and problem list.  Review of Systems A comprehensive review of systems was negative.   Objective:    BP 112/73   Pulse 70   Ht 5\' 6"  (1.676 m)   Wt 118 lb (53.5 kg)   LMP 10/20/2014   SpO2 99%   BMI 19.05 kg/m  General appearance: alert, cooperative and appears stated age Head: Normocephalic, without obvious abnormality, atraumatic Eyes: conj clear, EOMi, PEERLA  Ears: normal TM's and external ear canals both ears Nose: Nares normal. Septum midline. Mucosa normal. No drainage or sinus tenderness. Throat: lips, mucosa, and tongue normal; teeth and gums normal Neck: no adenopathy, no carotid bruit, no JVD, supple, symmetrical, trachea midline and thyroid not enlarged, symmetric, no tenderness/mass/nodules Back: symmetric, no curvature. ROM normal. No CVA tenderness. Lungs: clear to auscultation bilaterally Heart: regular rate and rhythm, S1, S2 normal, no murmur, click, rub or gallop Abdomen: soft, non-tender; bowel sounds normal; no masses,  no organomegaly Extremities: extremities normal, atraumatic, no cyanosis or edema Pulses: 2+ and symmetric Skin: Skin color, texture,  turgor normal. No rashes or lesions Lymph nodes: Cervical adenopathy: nl Neurologic: Alert and oriented X 3, normal strength and tone. Normal symmetric reflexes. Normal coordination and gait    Assessment:    Healthy female exam.      Plan:     See After Visit Summary for Counseling Recommendations   Keep up a regular exercise program and make sure you are eating a healthy diet Try to eat 4 servings of dairy a day, or if you are lactose intolerant take a calcium with vitamin D daily.  Your vaccines are up to date.  Wants to be tested for diabetes next time.   Discussed shingles vaccine.  H.O  provided.

## 2019-09-16 NOTE — Patient Instructions (Signed)
Health Maintenance, Female Adopting a healthy lifestyle and getting preventive care are important in promoting health and wellness. Ask your health care provider about:  The right schedule for you to have regular tests and exams.  Things you can do on your own to prevent diseases and keep yourself healthy. What should I know about diet, weight, and exercise? Eat a healthy diet   Eat a diet that includes plenty of vegetables, fruits, low-fat dairy products, and lean protein.  Do not eat a lot of foods that are high in solid fats, added sugars, or sodium. Maintain a healthy weight Body mass index (BMI) is used to identify weight problems. It estimates body fat based on height and weight. Your health care provider can help determine your BMI and help you achieve or maintain a healthy weight. Get regular exercise Get regular exercise. This is one of the most important things you can do for your health. Most adults should:  Exercise for at least 150 minutes each week. The exercise should increase your heart rate and make you sweat (moderate-intensity exercise).  Do strengthening exercises at least twice a week. This is in addition to the moderate-intensity exercise.  Spend less time sitting. Even light physical activity can be beneficial. Watch cholesterol and blood lipids Have your blood tested for lipids and cholesterol at 61 years of age, then have this test every 5 years. Have your cholesterol levels checked more often if:  Your lipid or cholesterol levels are high.  You are older than 61 years of age.  You are at high risk for heart disease. What should I know about cancer screening? Depending on your health history and family history, you may need to have cancer screening at various ages. This may include screening for:  Breast cancer.  Cervical cancer.  Colorectal cancer.  Skin cancer.  Lung cancer. What should I know about heart disease, diabetes, and high blood  pressure? Blood pressure and heart disease  High blood pressure causes heart disease and increases the risk of stroke. This is more likely to develop in people who have high blood pressure readings, are of African descent, or are overweight.  Have your blood pressure checked: ? Every 3-5 years if you are 18-39 years of age. ? Every year if you are 40 years old or older. Diabetes Have regular diabetes screenings. This checks your fasting blood sugar level. Have the screening done:  Once every three years after age 40 if you are at a normal weight and have a low risk for diabetes.  More often and at a younger age if you are overweight or have a high risk for diabetes. What should I know about preventing infection? Hepatitis B If you have a higher risk for hepatitis B, you should be screened for this virus. Talk with your health care provider to find out if you are at risk for hepatitis B infection. Hepatitis C Testing is recommended for:  Everyone born from 1945 through 1965.  Anyone with known risk factors for hepatitis C. Sexually transmitted infections (STIs)  Get screened for STIs, including gonorrhea and chlamydia, if: ? You are sexually active and are younger than 61 years of age. ? You are older than 61 years of age and your health care provider tells you that you are at risk for this type of infection. ? Your sexual activity has changed since you were last screened, and you are at increased risk for chlamydia or gonorrhea. Ask your health care provider if   you are at risk.  Ask your health care provider about whether you are at high risk for HIV. Your health care provider may recommend a prescription medicine to help prevent HIV infection. If you choose to take medicine to prevent HIV, you should first get tested for HIV. You should then be tested every 3 months for as long as you are taking the medicine. Pregnancy  If you are about to stop having your period (premenopausal) and  you may become pregnant, seek counseling before you get pregnant.  Take 400 to 800 micrograms (mcg) of folic acid every day if you become pregnant.  Ask for birth control (contraception) if you want to prevent pregnancy. Osteoporosis and menopause Osteoporosis is a disease in which the bones lose minerals and strength with aging. This can result in bone fractures. If you are 65 years old or older, or if you are at risk for osteoporosis and fractures, ask your health care provider if you should:  Be screened for bone loss.  Take a calcium or vitamin D supplement to lower your risk of fractures.  Be given hormone replacement therapy (HRT) to treat symptoms of menopause. Follow these instructions at home: Lifestyle  Do not use any products that contain nicotine or tobacco, such as cigarettes, e-cigarettes, and chewing tobacco. If you need help quitting, ask your health care provider.  Do not use street drugs.  Do not share needles.  Ask your health care provider for help if you need support or information about quitting drugs. Alcohol use  Do not drink alcohol if: ? Your health care provider tells you not to drink. ? You are pregnant, may be pregnant, or are planning to become pregnant.  If you drink alcohol: ? Limit how much you use to 0-1 drink a day. ? Limit intake if you are breastfeeding.  Be aware of how much alcohol is in your drink. In the [place] one drink equals one 12 oz bottle of beer (355 mL), one 5 oz glass of wine (148 mL), or one 1 oz glass of hard liquor (44 mL). General instructions  Schedule regular health, dental, and eye exams.  Stay current with your vaccines.  Tell your health care provider if: ? You often feel depressed. ? You have ever been abused or do not feel safe at home. Summary  Adopting a healthy lifestyle and getting preventive care are important in promoting health and wellness.  Follow your health care provider's instructions about healthy  diet, exercising, and getting tested or screened for diseases.  Follow your health care provider's instructions on monitoring your cholesterol and blood pressure. This information is not intended to replace advice given to you by your health care provider. Make sure you discuss any questions you have with your health care provider. Document Revised: 04/07/2018 Document Reviewed: 04/07/2018 Elsevier Patient Education  2020 Elsevier Inc.  

## 2019-09-19 ENCOUNTER — Encounter: Payer: Self-pay | Admitting: Family Medicine

## 2019-09-20 MED ORDER — DOXEPIN HCL 10 MG PO CAPS: 10.0000 mg | ORAL_CAPSULE | Freq: Every day | ORAL | 3 refills | Status: DC

## 2019-12-13 ENCOUNTER — Other Ambulatory Visit: Payer: Self-pay

## 2019-12-13 MED ORDER — DOXEPIN HCL 10 MG PO CAPS: 10.0000 mg | ORAL_CAPSULE | Freq: Every day | ORAL | 1 refills | Status: DC

## 2019-12-13 NOTE — Telephone Encounter (Signed)
90 day needed per ins

## 2020-01-10 IMAGING — DX DG SACRUM/COCCYX 2+V
3 series · 3 of 3 positions shown · non-contrast
Comparison: None.

CLINICAL DATA: Coccygeal pain without known injury.

EXAM:
SACRUM AND COCCYX - 2+ VIEW

[coccyx ap]
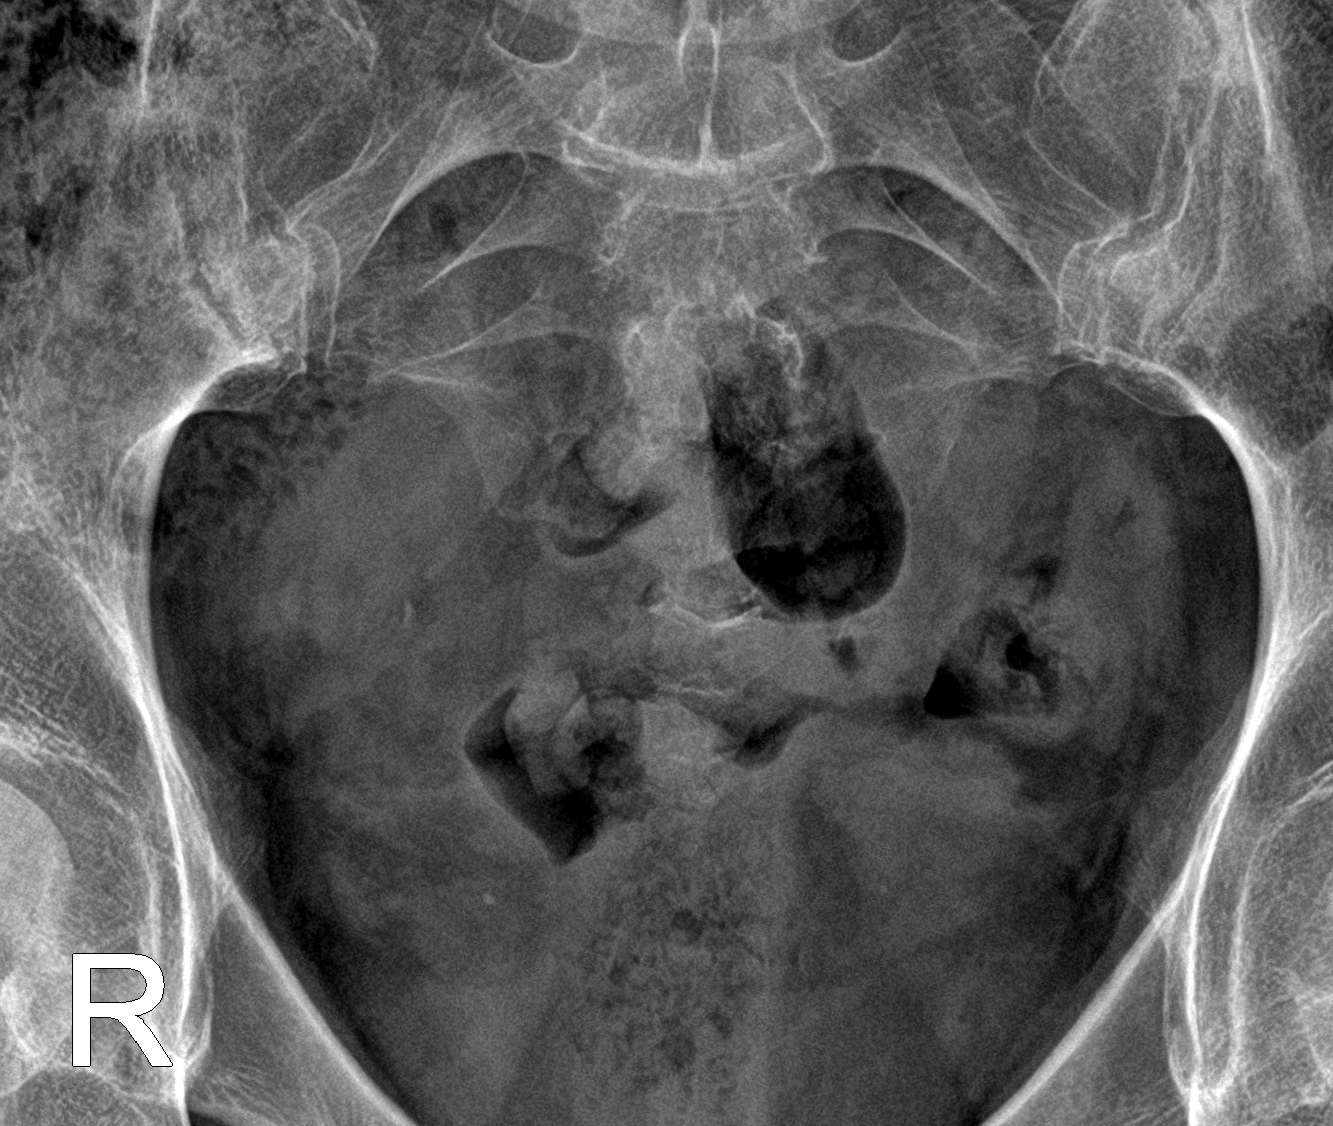

[sacrum ap]
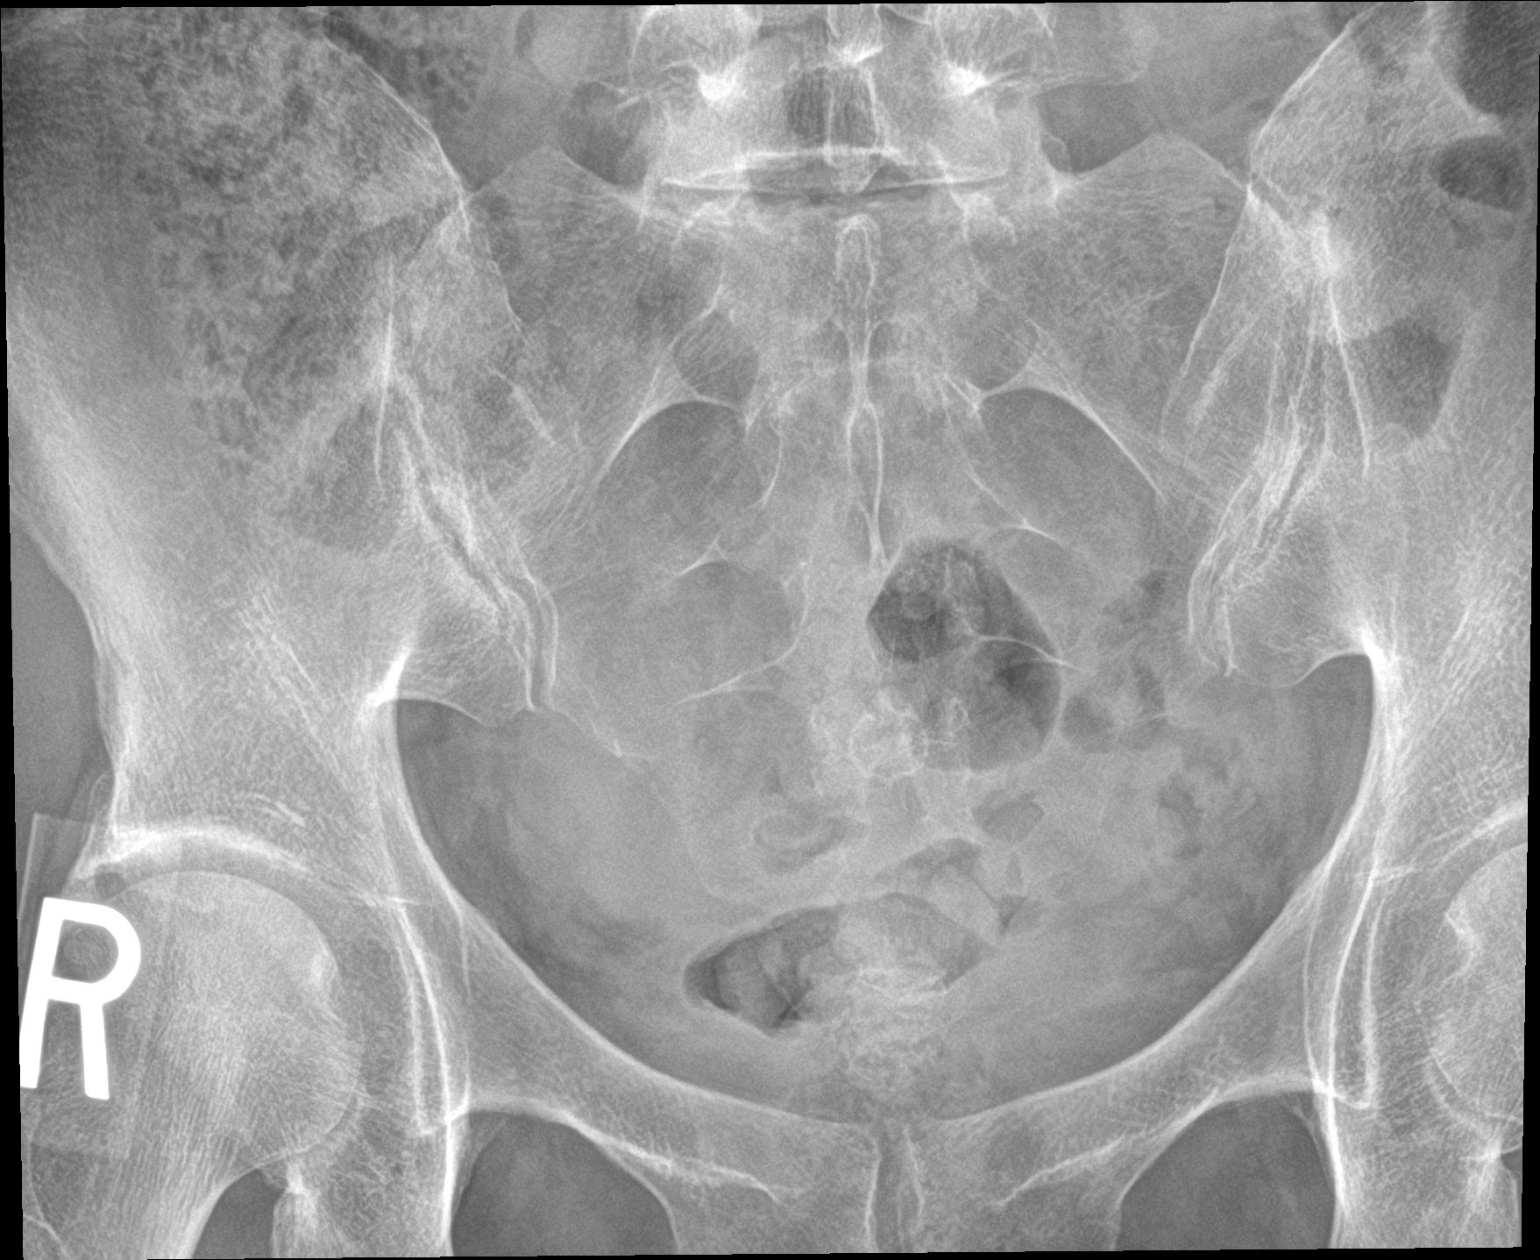

[sacrum lat]
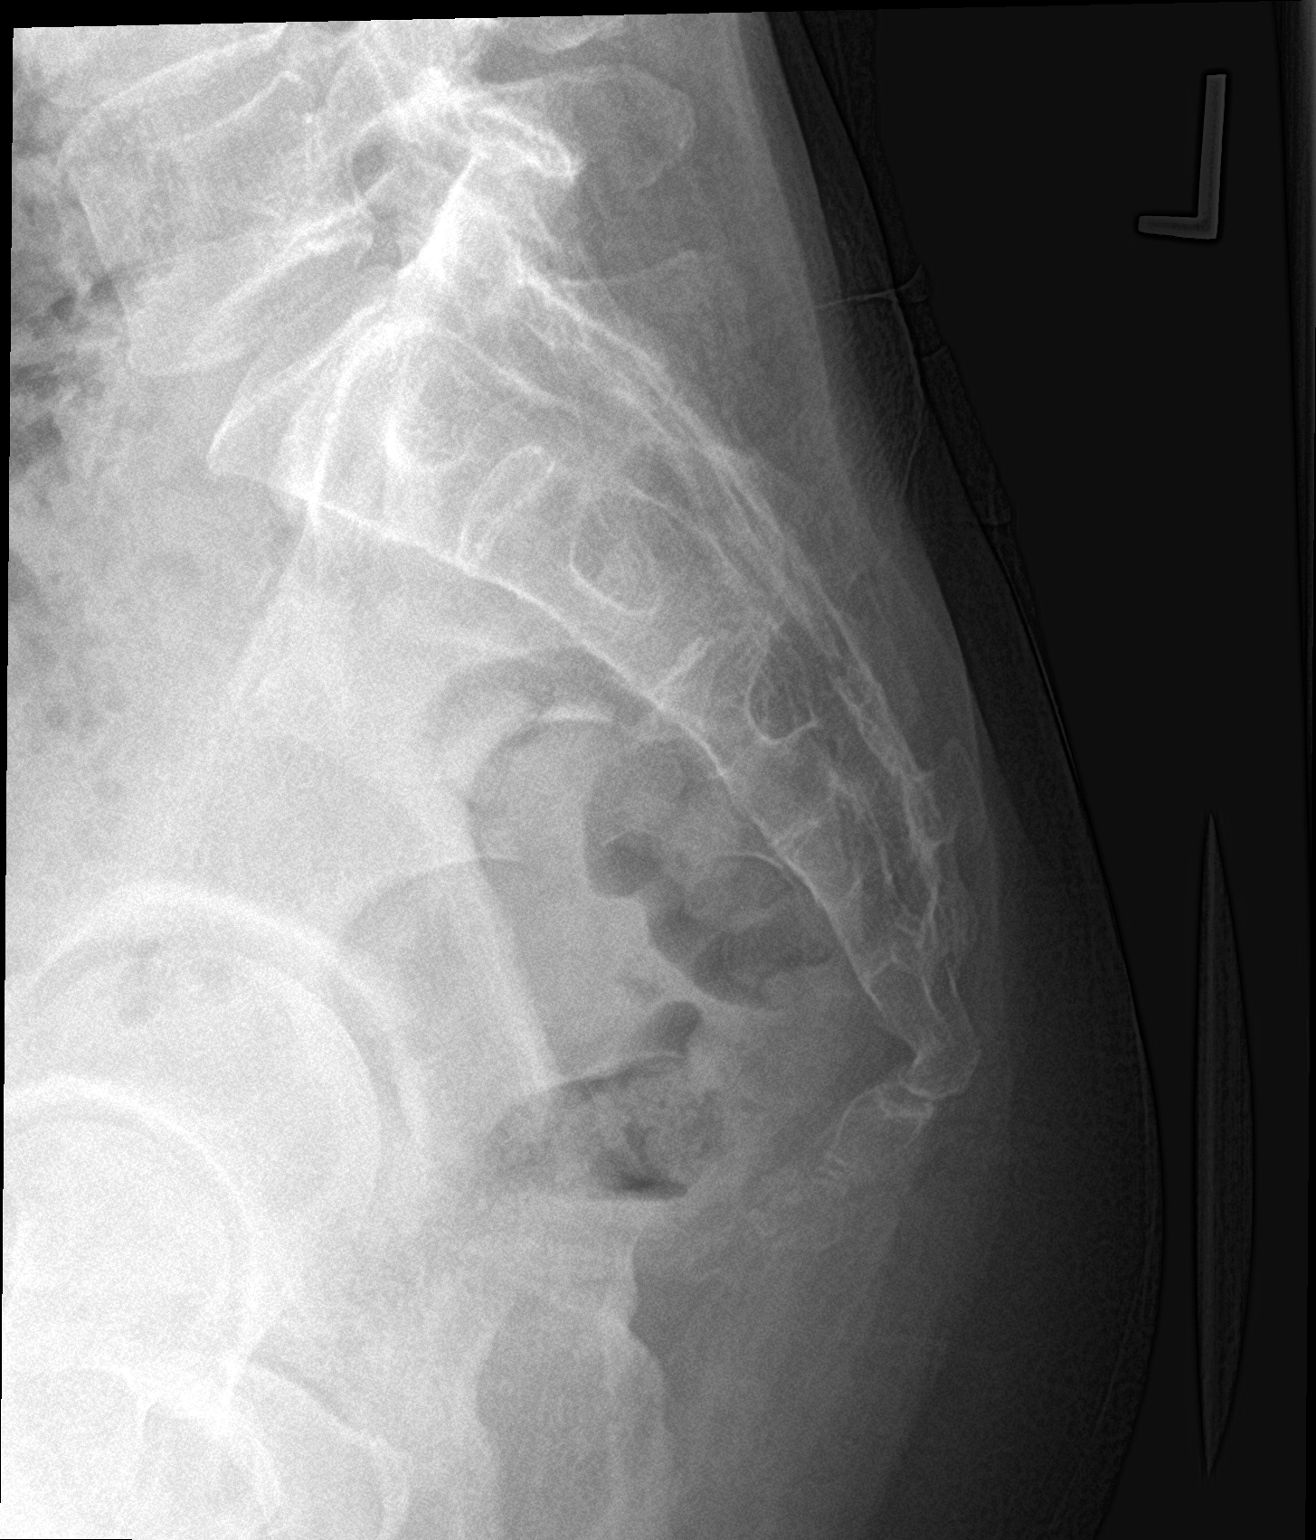

[3 of 3 positions shown; findings below may reference images not displayed]

FINDINGS: There is no evidence of fracture or other focal bone lesions.
IMPRESSION: Negative.

## 2020-01-19 ENCOUNTER — Encounter: Payer: Self-pay | Admitting: Family Medicine

## 2020-04-17 LAB — HM MAMMOGRAPHY

## 2020-05-30 ENCOUNTER — Other Ambulatory Visit: Payer: Self-pay | Admitting: Family Medicine

## 2020-06-14 ENCOUNTER — Other Ambulatory Visit: Payer: Self-pay | Admitting: Family Medicine

## 2020-07-09 ENCOUNTER — Ambulatory Visit (INDEPENDENT_AMBULATORY_CARE_PROVIDER_SITE_OTHER): Payer: Managed Care, Other (non HMO) | Admitting: Obstetrics & Gynecology

## 2020-07-09 ENCOUNTER — Encounter: Payer: Self-pay | Admitting: Obstetrics & Gynecology

## 2020-07-09 ENCOUNTER — Other Ambulatory Visit: Payer: Self-pay

## 2020-07-09 VITALS — BP 134/79 | HR 76 | Resp 16 | Ht 66.0 in | Wt 124.0 lb

## 2020-07-09 DIAGNOSIS — Z01419 Encounter for gynecological examination (general) (routine) without abnormal findings: Secondary | ICD-10-CM

## 2020-07-09 DIAGNOSIS — E559 Vitamin D deficiency, unspecified: Secondary | ICD-10-CM

## 2020-07-09 DIAGNOSIS — R8290 Unspecified abnormal findings in urine: Secondary | ICD-10-CM

## 2020-07-09 LAB — POCT URINALYSIS DIPSTICK
Appearance: NORMAL
Bilirubin, UA: NEGATIVE
Blood, UA: NEGATIVE
Glucose, UA: NEGATIVE
Ketones, UA: NEGATIVE
Leukocytes, UA: NEGATIVE
Nitrite, UA: NEGATIVE
Protein, UA: NEGATIVE
Spec Grav, UA: <=1.005 — AB (ref 1.010–1.025)
Urobilinogen, UA: NEGATIVE E.U./dL — AB
pH, UA: 6.5 (ref 5.0–8.0)

## 2020-07-09 NOTE — Progress Notes (Signed)
Subjective:     Laurie Cooper is a 60 y.o. female here for a routine exam.  Current complaints: none.  Husband is retiring at the end of this month and they are celebrating in [place].    Gynecologic History Patient's last menstrual period was 10/20/2014. Contraception: post menopausal status Last Pap: Jan 2021. Results were: normal Last mammogram: 12/21. Results were: normal  Obstetric History OB History  Gravida Para Term Preterm AB Living  3 2 2   1 2   SAB IAB Ectopic Multiple Live Births  1            # Outcome Date GA Lbr Len/2nd Weight Sex Delivery Anes PTL Lv  3 SAB           2 Term      Vag-Spont     1 Term      Vag-Spont        The following portions of the patient's history were reviewed and updated as appropriate: allergies, current medications, past family history, past medical history, past social history, past surgical history and problem list.  Review of Systems Pertinent items noted in HPI and remainder of comprehensive ROS otherwise negative.    Objective:      Vitals:   07/09/20 1316  BP: 134/79  Pulse: 76  Resp: 16  Weight: 124 lb (56.2 kg)  Height: 5\' 6"  (1.676 m)   Vitals:  WNL General appearance: alert, cooperative and no distress  HEENT: Normocephalic, without obvious abnormality, atraumatic Eyes: negative Throat: lips, mucosa, and tongue normal; teeth and gums normal  Respiratory: Clear to auscultation bilaterally  CV: Regular rate and rhythm  Breasts:  Normal appearance, no masses or tenderness, no nipple retraction or dimpling  GI: Soft, non-tender; bowel sounds normal; no masses,  no organomegaly  GU: External Genitalia:  Tanner V, no lesion Urethra:  No prolapse   Vagina: Pink, normal rugae, no blood or discharge  Cervix: No CMT, no lesion  Uterus:  Normal size and contour, non tender  Adnexa: Normal, no masses, non tender  Musculoskeletal: No edema, redness or tenderness in the calves or thighs  Skin: No lesions or rash   Lymphatic: Axillary adenopathy: none     Psychiatric: Normal mood and behavior        Assessment:    Healthy female exam.    Plan:    1.  Colon screening q 10 years 2.  Yearly mammograms 3.  Pap due earliest 2024 4.  Dexa 10 years after menopause; increase calcium intake (suggest chocolate calcium chew, pt has low BMI), weight bearing exercise.

## 2020-07-09 NOTE — Progress Notes (Signed)
Last mammogram  12/21 Last pap 1/21

## 2020-08-22 LAB — HM COLONOSCOPY

## 2020-10-02 ENCOUNTER — Encounter: Payer: Self-pay | Admitting: Family Medicine

## 2020-10-09 ENCOUNTER — Encounter: Payer: Self-pay | Admitting: Medical-Surgical

## 2020-10-09 ENCOUNTER — Other Ambulatory Visit: Payer: Self-pay

## 2020-10-09 ENCOUNTER — Ambulatory Visit (INDEPENDENT_AMBULATORY_CARE_PROVIDER_SITE_OTHER): Payer: Managed Care, Other (non HMO) | Admitting: Medical-Surgical

## 2020-10-09 VITALS — BP 114/68 | HR 68 | Temp 98.6°F | Ht 66.0 in | Wt 120.0 lb

## 2020-10-09 DIAGNOSIS — L237 Allergic contact dermatitis due to plants, except food: Secondary | ICD-10-CM | POA: Diagnosis not present

## 2020-10-09 MED ORDER — METHYLPREDNISOLONE SODIUM SUCC 125 MG IJ SOLR
125.0000 mg | Freq: Once | INTRAMUSCULAR | Status: AC
  Administered 2020-10-09: 125 mg via INTRAVENOUS

## 2020-10-09 MED ORDER — PREDNISONE 10 MG (48) PO TBPK: ORAL_TABLET | Freq: Every day | ORAL | 0 refills | Status: DC

## 2020-10-09 NOTE — Progress Notes (Signed)
Subjective:    CC: Possible poison ivy  HPI: Pleasant 60 year old female presenting with complaints of 2 days of a widespread rash that originally started on her right pinky finger.  Notes that the rash spread from her pinky and then to her thighs and is now spread to her chest and face.  She has been using hydrocortisone, calamine, and Benadryl with little relief.  Rash is significantly itchy and she notes that it did have some small blisters.  Denies fever, chills, myalgias, difficulty swallowing, swelling of the mouth/throat, and dyspnea.  No previous known allergy or reaction to poison ivy/oak/sumac but notes her husband is regularly exposed to it doing yard work.  I reviewed the past medical history, family history, social history, surgical history, and allergies today and no changes were needed.  Please see the problem list section below in epic for further details.  Past Medical History: Past Medical History:  Diagnosis Date   Kidney stone    Ovarian cyst    Tennis elbow    Past Surgical History: Past Surgical History:  Procedure Laterality Date   DILATATION & CURETTAGE/HYSTEROSCOPY WITH MYOSURE N/A 12/26/2016   Procedure: DILATATION & CURETTAGE/HYSTEROSCOPY WITH MYOSURE;  Surgeon: Guss Bunde, MD;  Location: [facility];  Service: Gynecology;  Laterality: N/A;  removal of polyp REP will be here confirmed on 12/23/16   ROTATOR CUFF REPAIR     left   TONSILLECTOMY AND ADENOIDECTOMY     Social History: Social History   Socioeconomic History   Marital status: Married    Spouse name: Not on file   Number of children: Not on file   Years of education: Not on file   Highest education level: Not on file  Occupational History   Occupation: Product manager: [employer]  Tobacco Use   Smoking status: Never   Smokeless tobacco: Never  Vaping Use   Vaping Use: Never used  Substance and Sexual Activity   Alcohol use: Yes    Comment: rarely   Drug use: No   Sexual  activity: Yes    Partners: Male    Birth control/protection: None    Comment: vasectomy  Other Topics Concern   Not on file  Social History Narrative   Not on file   Social Determinants of Health   Financial Resource Strain: Not on file  Food Insecurity: Not on file  Transportation Needs: Not on file  Physical Activity: Not on file  Stress: Not on file  Social Connections: Not on file   Family History: Family History  Problem Relation Age of Onset   Hodgkin's lymphoma Mother    Cancer Mother    Parkinson's disease Father    Heart attack Father    Diabetes Maternal Grandmother    Diabetes Paternal Grandmother    Ovarian cancer Paternal Aunt    Allergies: Allergies  Allergen Reactions   Nsaids Nausea And Vomiting   Medications: See med rec.  Review of Systems: See HPI for pertinent positives and negatives.   Objective:    General: Well Developed, well nourished, and in no acute distress.  Neuro: Alert and oriented x3.  HEENT: Normocephalic, atraumatic.  Skin: Warm and dry.  Scattered erythematous maculopapular rash affecting the lower face, neck, chest, arms, and posterior/inner thighs.  No current vesicles noted. Cardiac: Regular rate and rhythm, no murmurs rubs or gallops, no lower extremity edema.  Respiratory: Clear to auscultation bilaterally. Not using accessory muscles, speaking in full sentences.  Impression and Recommendations:  1. Poison ivy dermatitis Solu-Medrol 125 mg IM x1 today.  Start 12-day prednisone taper pack tomorrow.  Okay to continue hydrocortisone, calamine, and Benadryl if this is helpful.  Recommend using skin soothing lotions and consider those with oatmeal. - methylPREDNISolone sodium succinate (SOLU-MEDROL) 125 mg/2 mL injection 125 mg  Return if symptoms worsen or fail to improve. ___________________________________________ Clearnce Sorrel, DNP, APRN, FNP-BC [facility] [place]

## 2020-12-13 ENCOUNTER — Other Ambulatory Visit: Payer: Self-pay | Admitting: Family Medicine

## 2020-12-13 DIAGNOSIS — E559 Vitamin D deficiency, unspecified: Secondary | ICD-10-CM

## 2020-12-13 DIAGNOSIS — D508 Other iron deficiency anemias: Secondary | ICD-10-CM

## 2020-12-13 DIAGNOSIS — Z1159 Encounter for screening for other viral diseases: Secondary | ICD-10-CM

## 2020-12-13 DIAGNOSIS — E785 Hyperlipidemia, unspecified: Secondary | ICD-10-CM

## 2020-12-13 DIAGNOSIS — Z0000 Encounter for general adult medical examination without abnormal findings: Secondary | ICD-10-CM

## 2020-12-13 DIAGNOSIS — Z114 Encounter for screening for human immunodeficiency virus [HIV]: Secondary | ICD-10-CM

## 2020-12-13 NOTE — Telephone Encounter (Signed)
Call and lvm advising pt that her labs have been ordered.

## 2020-12-13 NOTE — Telephone Encounter (Signed)
Pt called. She has her physical scheduled for 8/30 and her lab appt for 8/24 at 8:15.  Please put in lab orders.  Thank you

## 2020-12-19 ENCOUNTER — Other Ambulatory Visit: Payer: Self-pay

## 2020-12-19 ENCOUNTER — Other Ambulatory Visit: Payer: Managed Care, Other (non HMO)

## 2020-12-20 LAB — CBC
HCT: 42.4 % (ref 35.0–45.0)
Hemoglobin: 14.4 g/dL (ref 11.7–15.5)
MCH: 30.6 pg (ref 27.0–33.0)
MCHC: 34 g/dL (ref 32.0–36.0)
MCV: 90 fL (ref 80.0–100.0)
MPV: 10 fL (ref 7.5–12.5)
Platelets: 284 10*3/uL (ref 140–400)
RBC: 4.71 10*6/uL (ref 3.80–5.10)
RDW: 12.5 % (ref 11.0–15.0)
WBC: 5.3 10*3/uL (ref 3.8–10.8)

## 2020-12-20 LAB — COMPLETE METABOLIC PANEL WITH GFR
AG Ratio: 2 (calc) (ref 1.0–2.5)
ALT: 12 U/L (ref 6–29)
AST: 15 U/L (ref 10–35)
Albumin: 4.5 g/dL (ref 3.6–5.1)
Alkaline phosphatase (APISO): 62 U/L (ref 37–153)
BUN: 10 mg/dL (ref 7–25)
CO2: 31 mmol/L (ref 20–32)
Calcium: 9.8 mg/dL (ref 8.6–10.4)
Chloride: 103 mmol/L (ref 98–110)
Creat: 0.73 mg/dL (ref 0.50–1.05)
Globulin: 2.3 g/dL (calc) (ref 1.9–3.7)
Glucose, Bld: 94 mg/dL (ref 65–99)
Potassium: 4.2 mmol/L (ref 3.5–5.3)
Sodium: 140 mmol/L (ref 135–146)
Total Bilirubin: 0.7 mg/dL (ref 0.2–1.2)
Total Protein: 6.8 g/dL (ref 6.1–8.1)
eGFR: 93 mL/min/{1.73_m2} (ref >=60)

## 2020-12-20 LAB — HIV ANTIBODY (ROUTINE TESTING W REFLEX): HIV 1&2 Ab, 4th Generation: NONREACTIVE

## 2020-12-20 LAB — IRON: Iron: 151 ug/dL (ref 45–160)

## 2020-12-20 LAB — FERRITIN: Ferritin: 73 ng/mL (ref 16–288)

## 2020-12-20 LAB — LIPID PANEL
Cholesterol: 259 mg/dL — ABNORMAL HIGH (ref <200)
HDL: 63 mg/dL (ref >=50)
LDL Cholesterol (Calc): 175 mg/dL (calc) — ABNORMAL HIGH
Non-HDL Cholesterol (Calc): 196 mg/dL (calc) — ABNORMAL HIGH (ref <130)
Total CHOL/HDL Ratio: 4.1 (calc) (ref <5.0)
Triglycerides: 95 mg/dL (ref <150)

## 2020-12-20 LAB — TSH: TSH: 0.86 mIU/L (ref 0.40–4.50)

## 2020-12-20 LAB — HEPATITIS C ANTIBODY
Hepatitis C Ab: NONREACTIVE
SIGNAL TO CUT-OFF: 0 (ref <1.00)

## 2020-12-20 LAB — VITAMIN D 25 HYDROXY (VIT D DEFICIENCY, FRACTURES): Vit D, 25-Hydroxy: 50 ng/mL (ref 30–100)

## 2020-12-20 LAB — B12 AND FOLATE PANEL
Folate: 9.9 ng/mL
Vitamin B-12: 714 pg/mL (ref 200–1100)

## 2020-12-21 NOTE — Progress Notes (Signed)
Hi Mitzie, LDL cholesterol is extremely high.  It is really been creeping up over the last 3 years.  All other labs look good.  Negative for HIV and hepatitis C.  The 10-year ASCVD risk score Mikey Bussing DC Brooke Bonito., et al., 2013) is: 3.6%   Values used to calculate the score:     Age: 60 years     Sex: Female     Is Non-Hispanic African American: No     Diabetic: No     Tobacco smoker: No     Systolic Blood Pressure: 99991111 mmHg     Is BP treated: No     HDL Cholesterol: 63 mg/dL     Total Cholesterol: 259 mg/dL

## 2020-12-25 ENCOUNTER — Other Ambulatory Visit: Payer: Self-pay

## 2020-12-25 ENCOUNTER — Ambulatory Visit (INDEPENDENT_AMBULATORY_CARE_PROVIDER_SITE_OTHER): Payer: Managed Care, Other (non HMO) | Admitting: Family Medicine

## 2020-12-25 ENCOUNTER — Encounter: Payer: Self-pay | Admitting: Family Medicine

## 2020-12-25 VITALS — BP 108/67 | HR 79 | Ht 66.0 in | Wt 120.4 lb

## 2020-12-25 DIAGNOSIS — Z0000 Encounter for general adult medical examination without abnormal findings: Secondary | ICD-10-CM

## 2020-12-25 DIAGNOSIS — D510 Vitamin B12 deficiency anemia due to intrinsic factor deficiency: Secondary | ICD-10-CM

## 2020-12-25 DIAGNOSIS — D508 Other iron deficiency anemias: Secondary | ICD-10-CM | POA: Diagnosis not present

## 2020-12-25 DIAGNOSIS — E559 Vitamin D deficiency, unspecified: Secondary | ICD-10-CM

## 2020-12-25 NOTE — Progress Notes (Signed)
Subjective:     Laurie Cooper is a 60 y.o. female and is here for a comprehensive physical exam. The patient reports no problems. 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    ROS is negative.   Social History   Socioeconomic History   Marital status: Married    Spouse name: Not on file   Number of children: Not on file   Years of education: Not on file   Highest education level: Not on file  Occupational History   Occupation: teacher    Employer: [employer]  Tobacco Use   Smoking status: Never   Smokeless tobacco: Never  Vaping Use   Vaping Use: Never used  Substance and Sexual Activity   Alcohol use: Yes    Comment: rarely   Drug use: No   Sexual activity: Yes    Partners: Male    Birth control/protection: None    Comment: vasectomy  Other Topics Concern   Not on file  Social History Narrative   Not on file   Social Determinants of Health   Financial Resource Strain: Not on file  Food Insecurity: Not on file  Transportation Needs: Not on file  Physical Activity: Not on file  Stress: Not on file  Social Connections: Not on file  Intimate Partner Violence: Not on file   Health Maintenance  Topic Date Due   Zoster Vaccines- Shingrix (1 of 2) Never done   MAMMOGRAM  02/26/2020   TETANUS/TDAP  11/10/2020   COVID-19 Vaccine (4 - Booster for Pfizer series) 03/27/2021 (Originally 07/05/2020)   INFLUENZA VACCINE  07/26/2021 (Originally 11/26/2020)   PAP SMEAR-Modifier  05/01/2022   COLONOSCOPY (Pts 45-31yr Insurance coverage will need to be confirmed)  08/22/2025   Hepatitis C Screening  Completed   HIV Screening  Completed   Pneumococcal Vaccine 04633Years old  Aged Out   HPV VACCINES  Aged Out    The following portions of the patient's history were reviewed and updated as appropriate: allergies, current medications, past family history, past medical history, past social history, past surgical history, and problem list.  Review of Systems Pertinent  items are noted in HPI.   Objective:    BP 108/67   Pulse 79   Ht '5\' 6"'$  (1.676 m)   Wt 120 lb 6.4 oz (54.6 kg)   LMP 10/20/2014   SpO2 100%   BMI 19.43 kg/m  General appearance: alert, cooperative, and appears stated age Head: Normocephalic, without obvious abnormality, atraumatic Eyes:  conj clear, EOMI, PEERLA Ears: normal TM's and external ear canals both ears Nose: Nares normal. Septum midline. Mucosa normal. No drainage or sinus tenderness. Throat: lips, mucosa, and tongue normal; teeth and gums normal Neck: no adenopathy, no carotid bruit, no JVD, supple, symmetrical, trachea midline, and thyroid not enlarged, symmetric, no tenderness/mass/nodules Back: symmetric, no curvature. ROM normal. No CVA tenderness. Lungs: clear to auscultation bilaterally Heart: regular rate and rhythm, S1, S2 normal, no murmur, click, rub or gallop Abdomen: soft, non-tender; bowel sounds normal; no masses,  no organomegaly Extremities: extremities normal, atraumatic, no cyanosis or edema Pulses: 2+ and symmetric Skin: Skin color, texture, turgor normal. No rashes or lesions Lymph nodes: Cervical adenopathy: nl and Supraclavicular adenopathy: nl Neurologic: Grossly normal    Assessment:    Healthy female exam.      Plan:     See After Visit Summary for Counseling Recommendations  Keep up a regular exercise program and make sure you are eating a  healthy diet Try to eat 4 servings of dairy a day, or if you are lactose intolerant take a calcium with vitamin D daily.  Your vaccines are up to date.  Labs are UTD. Mammo 03/2020 Colonoscopy UTD.   Due for Tdap and FLu  Consider Cardiac CT to better risk stratify.

## 2021-01-09 ENCOUNTER — Encounter: Payer: Self-pay | Admitting: Family Medicine

## 2021-01-14 ENCOUNTER — Encounter: Payer: Self-pay | Admitting: Family Medicine

## 2021-05-04 ENCOUNTER — Encounter: Payer: Self-pay | Admitting: Family Medicine

## 2021-06-14 ENCOUNTER — Other Ambulatory Visit: Payer: Self-pay | Admitting: Family Medicine

## 2021-07-23 ENCOUNTER — Encounter: Payer: Self-pay | Admitting: Family Medicine

## 2021-07-24 NOTE — Telephone Encounter (Signed)
Needs appt, if needing to start a new med we have never written.  [place]airs for virtual ?

## 2021-08-19 ENCOUNTER — Encounter: Payer: Self-pay | Admitting: Obstetrics & Gynecology

## 2021-08-19 ENCOUNTER — Telehealth: Payer: Self-pay | Admitting: Family Medicine

## 2021-08-19 ENCOUNTER — Ambulatory Visit (INDEPENDENT_AMBULATORY_CARE_PROVIDER_SITE_OTHER): Payer: Managed Care, Other (non HMO) | Admitting: Obstetrics & Gynecology

## 2021-08-19 VITALS — BP 118/74 | HR 70 | Ht 66.0 in | Wt 114.0 lb

## 2021-08-19 DIAGNOSIS — F419 Anxiety disorder, unspecified: Secondary | ICD-10-CM | POA: Diagnosis not present

## 2021-08-19 DIAGNOSIS — F32A Depression, unspecified: Secondary | ICD-10-CM

## 2021-08-19 DIAGNOSIS — Z139 Encounter for screening, unspecified: Secondary | ICD-10-CM

## 2021-08-19 DIAGNOSIS — Z01419 Encounter for gynecological examination (general) (routine) without abnormal findings: Secondary | ICD-10-CM

## 2021-08-19 DIAGNOSIS — R636 Underweight: Secondary | ICD-10-CM

## 2021-08-19 NOTE — Telephone Encounter (Signed)
Call patient and see if we can get her on the schedule either later this week or early next week to follow-up for mood. ?

## 2021-08-19 NOTE — Progress Notes (Signed)
Last Mammogram: 04/17/20- normal ?Last Pap Smear:  05/02/19- negative ?Last Colon Screening;  2022 ?Seat Belts:   Yes ?[name] Screen:   Yes ?Dental Check Up:  Yes ?Brush & Floss:  Yes  ?

## 2021-08-19 NOTE — Telephone Encounter (Signed)
Left message for a return call to schedule a follow up appointment.  ?

## 2021-08-19 NOTE — Telephone Encounter (Signed)
-----   Message from Guss Bunde, MD sent at 08/19/2021 11:05 AM EDT ----- ?Hi ?Mitzis GAD is 13 and PHQ9 is 14.  She is on Doxepin.  I belive you prescribed it.  She is interested in a change in medication.  Can you have her office call for an appt (sooner rather than later would be great!).  ? ?Thanks, ? ?Claiborne Billings  ? ?

## 2021-08-19 NOTE — Progress Notes (Signed)
Subjective:  ?  ? Laurie Cooper is a 61 y.o. female here for a routine exam.  Current complaints: weight loss after stopping antidepressants and recently restarted.  She feels like she still has symptoms of anxiety which the TCA antidepressant would not cover.  She does feel like the meds help her sleep at night.  Father recently died (Parkinson's); Mother is 34 and she is "very demanding" and she is stressed by the requests.  ?  ?Gynecologic History ?Patient's last menstrual period was 10/20/2014. ?Contraception: post menopausal status ?Last Mammogram: 04/17/20- normal ?Last Pap Smear:  05/02/19- negative ?Last Colon Screening;  2022 ?Seat Belts:   Yes ?[name] Screen:   Yes ?Dental Check Up:  Yes ?Brush & Floss:  Yes  ? ?Obstetric History ?OB History  ?Gravida Para Term Preterm AB Living  ?'3 2 2   1 2  '$ ?SAB IAB Ectopic Multiple Live Births  ?1          ?  ?# Outcome Date GA Lbr Len/2nd Weight Sex Delivery Anes PTL Lv  ?3 SAB           ?2 Term      Vag-Spont     ?1 Term      Vag-Spont     ? ? ? ?The following portions of the patient's history were reviewed and updated as appropriate: allergies, current medications, past family history, past medical history, past social history, past surgical history, and problem list. ? ?Review of Systems ?Pertinent items noted in HPI and remainder of comprehensive ROS otherwise negative.  ?  ?Objective:  ? ?  ?Vitals:  ? 08/19/21 0956  ?BP: 118/74  ?Pulse: 70  ?Weight: 114 lb (51.7 kg)  ?Height: '5\' 6"'$  (1.676 m)  ? ?Vitals:  WNL ?General appearance: alert, cooperative and no distress ? ?HEENT: Normocephalic, without obvious abnormality, atraumatic ?Eyes: negative ?Throat: lips, mucosa, and tongue normal; teeth and gums normal  ?Respiratory: Clear to auscultation bilaterally  ?CV: Regular rate and rhythm  ?Breasts:  Normal appearance, no masses or tenderness, no nipple retraction or dimpling  ?GI: Soft, non-tender; bowel sounds normal; no masses,  no organomegaly  ?GU: External  Genitalia:  Tanner V, no lesion ?Urethra:  No prolapse   ?Vagina: Pink, normal rugae, no blood or discharge  ?Cervix: No CMT, no lesion  ?Uterus:  Normal size and contour, non tender  ?Adnexa: Normal, no masses, non tender  ?Musculoskeletal: No edema, redness or tenderness in the calves or thighs  ?Skin: No lesions or rash  ?Lymphatic: Axillary adenopathy: none     ?Psychiatric: Normal mood and behavior  ? ? ?   ? ?Assessment:  ? ? Healthy female exam.  ?Depression & Anxiety (13 & 14) ?Underweight ?  ?Plan:  ? ?Pap smear next year ?Yearly mammograms ?DEXA ?Cont Calcium and Vitamin D ?Add Ensure daily  ?Dr. Hessie Dibble note and they will schedule her for a visit to discuss medication changes for anxiety and depression; I recommend counseling to establish boundaries with her mother.  ? ?In addition to the yearly exam, 3 problems were discussed--anxiety, depression, and underweight/low BMI.  20 mins were spent addressing these complaints and coordinating care.  ?

## 2021-08-21 NOTE — Telephone Encounter (Signed)
Patient scheduled.

## 2021-08-23 ENCOUNTER — Telehealth (INDEPENDENT_AMBULATORY_CARE_PROVIDER_SITE_OTHER): Payer: Managed Care, Other (non HMO) | Admitting: Family Medicine

## 2021-08-23 ENCOUNTER — Encounter: Payer: Self-pay | Admitting: Family Medicine

## 2021-08-23 VITALS — Ht 66.0 in | Wt 114.0 lb

## 2021-08-23 DIAGNOSIS — F418 Other specified anxiety disorders: Secondary | ICD-10-CM | POA: Diagnosis not present

## 2021-08-23 MED ORDER — ESCITALOPRAM OXALATE 10 MG PO TABS: ORAL_TABLET | ORAL | 0 refills | Status: DC

## 2021-08-23 NOTE — Progress Notes (Signed)
Pt stated that her GYN advised her that she should f/u with her pcp to discuss medication options that would help her with the anxiety and depression. ? ?She stated that at one time she did try going off of the Doxepin 10 mg (pt stated that she had discussed this with pcp back in January due to it causing her to experience weight loss).  ?

## 2021-08-23 NOTE — Assessment & Plan Note (Signed)
We discussed options including starting an SSRI.  She could opt to taper the doxepin since she has not been the happiest with it or keep it along with the SSRI since it is a low-dose that would absolutely be reasonable.  We will start with Lexapro.  Call if any problems or concerns otherwise we will plan to follow-up in 1 month and we can make adjustments at that time if needed.  Is open to therapy/counseling she says she was actually starting to go to some counseling before COVID hit.  She could certainly get back in with that provider.  Or let us know if she needs an official referral. ?

## 2021-08-23 NOTE — Progress Notes (Signed)
? ? ?Virtual Visit via Video Note ? ?I connected with Laurie Cooper on 08/23/21 at  1:00 PM EDT by a video enabled telemedicine application and verified that I am speaking with the correct person using two identifiers. ?  ?I discussed the limitations of evaluation and management by telemedicine and the availability of in person appointments. The patient expressed understanding and agreed to proceed. ? ?Patient location: at home ?Provider location: in office ? ?Subjective:   ? ?CC:   ?Chief Complaint  ?Patient presents with  ? mood  ? ? ?HPI: ?She wanted to discuss her mood today.  She did a GAD-7 and PHQ-9 screening with her OB/GYN.  And her GYN recommended that she follow-up with [place]. ?She was on the doxepin deviously and caused some weight gain, but did help her sleep. But felt she was feeling very flat and felt like she was in a fog.  So she eventually tapered off and discontinued it.  Her father recently passed.  Found that this time was pretty stressful for her.  So she restarted the old medication.   ? ?Completed questionnaire for anxiety and depression and answered yes the questions. PHQ 9 score of 14.  GAD-7 score of 13. ? ?Past medical history, Surgical history, Family history not pertinant except as noted below, Social history, Allergies, and medications have been entered into the medical record, reviewed, and corrections made.  ? ? ?Objective:   ? ?General: Speaking clearly in complete sentences without any shortness of breath.  Alert and oriented x3.  Normal judgment. No apparent acute distress. ? ? ? ?Impression and Recommendations:   ? ?Problem List Items Addressed This Visit   ? ?  ? Other  ? Depression with anxiety - Primary  ?  We discussed options including starting an SSRI.  She could opt to taper the doxepin since she has not been the happiest with it or keep it along with the SSRI since it is a low-dose that would absolutely be reasonable.  We will start with Lexapro.  Call if any problems or  concerns otherwise we will plan to follow-up in 1 month and we can make adjustments at that time if needed.  Is open to therapy/counseling she says she was actually starting to go to some counseling before COVID hit.  She could certainly get back in with that provider.  Or let us know if she needs an official referral. ? ?  ?  ? Relevant Medications  ? escitalopram (LEXAPRO) 10 MG tablet  ? ? ?No orders of the defined types were placed in this encounter. ? ? ?Meds ordered this encounter  ?Medications  ? escitalopram (LEXAPRO) 10 MG tablet  ?  Sig: Take 0.5 tablets (5 mg total) by mouth daily for 14 days, THEN 1 tablet (10 mg total) daily for 20 days.  ?  Dispense:  30 tablet  ?  Refill:  0  ? ? ? ?I discussed the assessment and treatment plan with the patient. The patient was provided an opportunity to ask questions and all were answered. The patient agreed with the plan and demonstrated an understanding of the instructions. ?  ?The patient was advised to call back or seek an in-person evaluation if the symptoms worsen or if the condition fails to improve as anticipated. ? ?I spent 20 minutes on the day of the encounter to include pre-visit record review, face-to-face time with the patient and post visit ordering of test. ? ? ?Beatrice Lecher, MD  ? ?

## 2021-08-28 ENCOUNTER — Other Ambulatory Visit (INDEPENDENT_AMBULATORY_CARE_PROVIDER_SITE_OTHER): Payer: Managed Care, Other (non HMO)

## 2021-08-28 ENCOUNTER — Ambulatory Visit (INDEPENDENT_AMBULATORY_CARE_PROVIDER_SITE_OTHER): Payer: Managed Care, Other (non HMO)

## 2021-08-28 DIAGNOSIS — Z1329 Encounter for screening for other suspected endocrine disorder: Secondary | ICD-10-CM

## 2021-08-28 DIAGNOSIS — Z780 Asymptomatic menopausal state: Secondary | ICD-10-CM | POA: Diagnosis not present

## 2021-08-28 DIAGNOSIS — Z139 Encounter for screening, unspecified: Secondary | ICD-10-CM

## 2021-08-28 NOTE — Progress Notes (Signed)
Pt here for TSH per Dr.Leggett. Pt given lab order and sent to lab.  ?

## 2021-09-10 ENCOUNTER — Telehealth: Payer: Self-pay | Admitting: *Deleted

## 2021-09-10 ENCOUNTER — Encounter: Payer: Self-pay | Admitting: Obstetrics & Gynecology

## 2021-09-10 DIAGNOSIS — M810 Age-related osteoporosis without current pathological fracture: Secondary | ICD-10-CM | POA: Insufficient documentation

## 2021-09-10 NOTE — Telephone Encounter (Signed)
Left patient a message to call and schedule lab and f/u visit after labs come back per Dr. Gala Romney. ?

## 2021-09-11 ENCOUNTER — Other Ambulatory Visit (INDEPENDENT_AMBULATORY_CARE_PROVIDER_SITE_OTHER): Payer: Managed Care, Other (non HMO)

## 2021-09-11 DIAGNOSIS — M810 Age-related osteoporosis without current pathological fracture: Secondary | ICD-10-CM

## 2021-09-11 NOTE — Progress Notes (Signed)
Pt given lab orders and sent to lab. F/U appt scheduled for 5/22.

## 2021-09-12 LAB — VITAMIN D 25 HYDROXY (VIT D DEFICIENCY, FRACTURES): Vit D, 25-Hydroxy: 54 ng/mL (ref 30–100)

## 2021-09-12 LAB — CALCIUM: Calcium: 9.9 mg/dL (ref 8.6–10.4)

## 2021-09-12 LAB — CREATININE, SERUM: Creat: 0.71 mg/dL (ref 0.50–1.05)

## 2021-09-14 LAB — T4, FREE: Free T4: 1.2 ng/dL (ref 0.8–1.8)

## 2021-09-14 LAB — TSH: TSH: 0.44 mIU/L (ref 0.40–4.50)

## 2021-09-14 LAB — T3, FREE: T3, Free: 3.4 pg/mL (ref 2.3–4.2)

## 2021-09-16 ENCOUNTER — Encounter: Payer: Self-pay | Admitting: Family Medicine

## 2021-09-16 ENCOUNTER — Encounter: Payer: Self-pay | Admitting: Obstetrics & Gynecology

## 2021-09-16 ENCOUNTER — Telehealth (INDEPENDENT_AMBULATORY_CARE_PROVIDER_SITE_OTHER): Payer: Managed Care, Other (non HMO) | Admitting: Obstetrics & Gynecology

## 2021-09-16 DIAGNOSIS — M810 Age-related osteoporosis without current pathological fracture: Secondary | ICD-10-CM

## 2021-09-16 NOTE — Progress Notes (Addendum)
GYNECOLOGY VIRTUAL VISIT ENCOUNTER NOTE  Provider location: [facility] [place]   Patient location: Home  I connected with Laurie Cooper on 09/16/21 at 11:10 AM EDT by MyChart Video Encounter and verified that I am speaking with the correct person using two identifiers.   I discussed the limitations, risks, security and privacy concerns of performing an evaluation and management service virtually and the availability of in person appointments. I also discussed with the patient that there may be a patient responsible charge related to this service. The patient expressed understanding and agreed to proceed.   History:  Laurie Cooper is a 61 y.o. G16P2012 female being evaluated today for discussion for osteoporosis found on bone mineral density scan.  Patient had a normal creatinine calcium and vitamin D drawn last week.     Past Medical History:  Diagnosis Date   Kidney stone    Ovarian cyst    Tennis elbow    Past Surgical History:  Procedure Laterality Date   DILATATION & CURETTAGE/HYSTEROSCOPY WITH MYOSURE N/A 12/26/2016   Procedure: DILATATION & CURETTAGE/HYSTEROSCOPY WITH MYOSURE;  Surgeon: Guss Bunde, MD;  Location: [facility];  Service: Gynecology;  Laterality: N/A;  removal of polyp REP will be here confirmed on 12/23/16   ROTATOR CUFF REPAIR     left   TONSILLECTOMY AND ADENOIDECTOMY     The following portions of the patient's history were reviewed and updated as appropriate: allergies, current medications, past family history, past medical history, past social history, past surgical history and problem list.   Review of Systems:  Pertinent items noted in HPI and remainder of comprehensive ROS otherwise negative.  Physical Exam:   General:  Alert, oriented and cooperative. Patient appears to be in no acute distress.  Mental Status: Normal mood and affect. Normal behavior. Normal judgment and thought content.   Respiratory: Normal  respiratory effort, no problems with respiration noted  Rest of physical exam deferred due to type of encounter  Labs and Imaging Results for orders placed or performed in visit on 09/11/21 (from the past 336 hour(s))  Calcium   Collection Time: 09/11/21 10:33 AM  Result Value Ref Range   Calcium 9.9 8.6 - 10.4 mg/dL  Creatinine   Collection Time: 09/11/21 10:33 AM  Result Value Ref Range   Creat 0.71 0.50 - 1.05 mg/dL  Vitamin D (25 hydroxy)   Collection Time: 09/11/21 10:33 AM  Result Value Ref Range   Vit D, 25-Hydroxy 54 30 - 100 ng/mL  Results for orders placed or performed in visit on 08/28/21 (from the past 336 hour(s))  TSH   Collection Time: 09/11/21 10:39 AM  Result Value Ref Range   TSH 0.44 0.40 - 4.50 mIU/L  T4, free   Collection Time: 09/11/21 10:39 AM  Result Value Ref Range   Free T4 1.2 0.8 - 1.8 ng/dL  T3, free   Collection Time: 09/11/21 10:39 AM  Result Value Ref Range   T3, Free 3.4 2.3 - 4.2 pg/mL   DG BONE DENSITY (DXA)  Result Date: 08/28/2021 EXAM: DUAL X-RAY ABSORPTIOMETRY (DXA) FOR BONE MINERAL DENSITY IMPRESSION: Laurie Cooper Your patient Laurie Cooper completed a BMD test on 08/28/2021 using the [place] (analysis version: 16.SP2) manufactured by [employer]. The following summarizes the results of our evaluation. [place] PATIENT: Name: Laurie Cooper Patient ID: [id number] Birth Date: [birth year] Height: 65.5 in. Gender: Female Measured: 08/28/2021 Weight: 112.8 lbs. Indications: Caucasian, Estrogen Deficiency,  Low Calcium Intake, Postmenopausal Fractures: Treatments: Vitamin D ASSESSMENT: The BMD measured at Femur Total Left is 0.673 g/cm2 with a T-score of -2.7. This patient is considered osteoporotic according to [facility]) criteria. The scan quality is good. Site Region Measured Date Measured Age WHO YA BMD Classification T-score AP Spine L1-L4 08/28/20[age] Osteoporosis -2.6 0.872 g/cm2 DualFemur Total Left  6/28/1821 [age] years Osteoporosis -2.7 0.673 g/cm2 World Health Organization [facility]) criteria for post-menopausal, Caucasian Women: Normal        T-score at or above -1 SD Low Bone Mass T-score between -1 and -2.5 SD Osteoporosis  T-score at or below -2.5 SD RECOMMENDATION: 1. All patients should optimize calcium and vitamin D intake. 2. Consider FDA-approved medical therapies in postmenopausal women and men age 27 years and older, based on the following: a. A hip or vertebral(clinical or morphometric) fracture b. T-score < -2.5 at the femoral neck or spine after appropriate evaluation to exclude secondary causes c. Low bone mass (T-score between -1.0 and -2.5 at the femoral neck or spine) and a 10-year probability of a hip fracture > 3% or a 10-year probability of a major osteoporosis-related fracture > 20% based on the [place]-adapted WHO algorithm d. Clinician judgement and/or patient preferences may indicate treatment for people with 10-year fracture probabilities above or below these levels FOLLOW-UP: Patients with diagnosis of osteoporosis or at high risk for fracture should have regular bone mineral density tests. For patients eligible for Medicare, routine testing is allowed once every 2 years. The testing frequency can be increased to one year for patients who have rapidly progressing disease, those who are receiving or discontinuing medical therapy to restore bone mass, or have additional risk factors. I have reviewed this report, and agree with the above findings [facility] Radiology Electronically Signed   By: Ammie Ferrier M.D.   On: 08/28/2021 13:12       Assessment and Plan:     61 year old female with osteoporosis based on last DEXA scan Patient would like to discuss herbal treatments with her herbalist.  She is aware that there are no herbal treatments that have been proven in the scientific literature to work.  If she chooses to try an herbal treatment we can repeat her bone mineral density scan  in 1 year.  She is aware that we would use oral medications first and that she would need to take the medication on an empty stomach upright with 8 ounces of water.  There is a risk of erosive esophagitis and a rare risk of osteonecrosis of the jaw.      I discussed the assessment and treatment plan with the patient. The patient was provided an opportunity to ask questions and all were answered. The patient agreed with the plan and demonstrated an understanding of the instructions.   The patient was advised to call back or seek an in-person evaluation/go to the ED if the symptoms worsen or if the condition fails to improve as anticipated.  I provided 20 minutes of face-to-face time during this encounter.   [name], MD [facility] [place]

## 2021-09-18 ENCOUNTER — Other Ambulatory Visit: Payer: Self-pay | Admitting: Family Medicine

## 2021-09-18 DIAGNOSIS — F418 Other specified anxiety disorders: Secondary | ICD-10-CM

## 2021-09-18 MED ORDER — DOXEPIN HCL 10 MG PO CAPS: 10.0000 mg | ORAL_CAPSULE | Freq: Every day | ORAL | 1 refills | Status: DC

## 2021-09-18 NOTE — Telephone Encounter (Signed)
Patient sent mychart message about medication 5/22. Not sure if you want to keep on medication?   (Message from patient) After starting the Lexapro  '5mg'$  (while weaning off the Doxepin)my mood improved dramatically.  After 2 weeks I changed to Lexapro '10mg'$  with no Doxepin and have had some side effects - itching, insomnia and stomach issues with more anxiety. Would it be reasonable to restart Doxepin at night and Lexapro '5mg'$  at morning? My next appointment with you is not until 6/12.  Could you please call in refills on both- my insurance requires 90 day supplies?

## 2021-09-18 NOTE — Telephone Encounter (Signed)
Meds ordered this encounter  Medications   escitalopram (LEXAPRO) 5 MG tablet    Sig: Take 1 tablet (5 mg total) by mouth daily. Appt for further refills    Dispense:  90 tablet    Refill:  0   doxepin (SINEQUAN) 10 MG capsule    Sig: Take 1 capsule (10 mg total) by mouth at bedtime.    Dispense:  90 capsule    Refill:  1

## 2021-10-01 ENCOUNTER — Telehealth: Payer: Managed Care, Other (non HMO) | Admitting: Family Medicine

## 2021-10-07 ENCOUNTER — Telehealth: Payer: Managed Care, Other (non HMO) | Admitting: Family Medicine

## 2021-10-08 ENCOUNTER — Telehealth (INDEPENDENT_AMBULATORY_CARE_PROVIDER_SITE_OTHER): Payer: 59 | Admitting: Family Medicine

## 2021-10-08 DIAGNOSIS — F418 Other specified anxiety disorders: Secondary | ICD-10-CM | POA: Diagnosis not present

## 2021-10-08 DIAGNOSIS — R69 Illness, unspecified: Secondary | ICD-10-CM | POA: Diagnosis not present

## 2021-10-08 NOTE — Assessment & Plan Note (Signed)
Doing well.  Will continue current regimen.  She really feels like this is a great combination to control her mood and her sleep.  She feels really positive about at her husband's noted a positive difference as well.  She recently switched insurance plans as her husband went on Medicare so she had to come off of his plan and find her own.  So she is still looking to connect with a therapist as well.  Plan to f/u in 4 months.

## 2021-10-08 NOTE — Progress Notes (Signed)
Called pt and lvm advising that I was calling to do her prescreening. Called pt 2nd time lvm asking that she log in so Dr. Madilyn Fireman can start her visit at 1pm.   Previous PHQ= 14, GAD=13

## 2021-10-08 NOTE — Progress Notes (Signed)
    Virtual Visit via Video Note  I connected with Laurie Cooper on 10/08/21 at  1:00 PM EDT by a video enabled telemedicine application and verified that I am speaking with the correct person using two identifiers.   I discussed the limitations of evaluation and management by telemedicine and the availability of in person appointments. The patient expressed understanding and agreed to proceed.  Patient location: at home Provider location: in office  Subjective:    CC:  No chief complaint on file.   HPI: She ended up going back down to 5 mg of Lexapro. She is doing well on that dose.  She has felt really good on  that dose and taking doxepin 10 mg at night.   She is up 3 lbs, which is great.   She has been doing well. She does occ notices change in vision. Occ cider drink maybe twice a week. Wanted to make sure that was [facility].    Past medical history, Surgical history, Family history not pertinant except as noted below, Social history, Allergies, and medications have been entered into the medical record, reviewed, and corrections made.    Objective:    General: Speaking clearly in complete sentences without any shortness of breath.  Alert and oriented x3.  Normal judgment. No apparent acute distress.    Impression and Recommendations:    Problem List Items Addressed This Visit       Other   Depression with anxiety - Primary    Doing well.  Will continue current regimen.  She really feels like this is a great combination to control her mood and her sleep.  She feels really positive about at her husband's noted a positive difference as well.  She recently switched insurance plans as her husband went on Medicare so she had to come off of his plan and find her own.  So she is still looking to connect with a therapist as well.  Plan to f/u in 4 months.         No orders of the defined types were placed in this encounter.   No orders of the defined types were placed in this  encounter.  Did encourage her to get her shingles vaccine were happy to schedule here or she can have it done at her pharmacy if she would like.  She thinks she had her tetanus about 4 years ago when her grandbaby was born.  But she will try to get that date for [place] so we can get her chart updated.  I spent 15 minutes on the day of the encounter to include pre-visit record review, face-to-face time with the patient and post visit ordering of test.   I discussed the assessment and treatment plan with the patient. The patient was provided an opportunity to ask questions and all were answered. The patient agreed with the plan and demonstrated an understanding of the instructions.   The patient was advised to call back or seek an in-person evaluation if the symptoms worsen or if the condition fails to improve as anticipated.   Beatrice Lecher, MD

## 2021-12-14 ENCOUNTER — Other Ambulatory Visit: Payer: Self-pay | Admitting: Family Medicine

## 2021-12-14 DIAGNOSIS — F418 Other specified anxiety disorders: Secondary | ICD-10-CM

## 2022-01-20 ENCOUNTER — Encounter: Payer: Self-pay | Admitting: Family Medicine

## 2022-01-20 ENCOUNTER — Ambulatory Visit (INDEPENDENT_AMBULATORY_CARE_PROVIDER_SITE_OTHER): Payer: 59 | Admitting: Family Medicine

## 2022-01-20 VITALS — BP 114/72 | HR 71 | Ht 66.0 in | Wt 121.0 lb

## 2022-01-20 DIAGNOSIS — Z23 Encounter for immunization: Secondary | ICD-10-CM

## 2022-01-20 DIAGNOSIS — F418 Other specified anxiety disorders: Secondary | ICD-10-CM

## 2022-01-20 DIAGNOSIS — R69 Illness, unspecified: Secondary | ICD-10-CM | POA: Diagnosis not present

## 2022-01-20 DIAGNOSIS — Z0000 Encounter for general adult medical examination without abnormal findings: Secondary | ICD-10-CM

## 2022-01-20 DIAGNOSIS — Z1231 Encounter for screening mammogram for malignant neoplasm of breast: Secondary | ICD-10-CM | POA: Diagnosis not present

## 2022-01-20 NOTE — Assessment & Plan Note (Signed)
Feels that the 5 mg Lexapro is been working really well with really taken the edge off.  Her mom has also moved into a senior care center which has been really helpful and would have the ability to transition into assisted living if needed.  He is going to plan on starting to taper the doxepin for sleep.

## 2022-01-20 NOTE — Progress Notes (Signed)
Complete physical exam  Patient: Laurie Cooper   DOB: 1960/05/27   61 y.o. Female  MRN: [id number]  Subjective:    Chief Complaint  Patient presents with   Annual Exam    Laurie Cooper is a 61 y.o. female who presents today for a complete physical exam. She reports consuming a general diet.  Walking for exercise.  She generally feels well. She reports sleeping fairly well. She does not have additional problems to discuss today.    Most recent fall risk assessment:    01/20/2022   10:17 AM  Fall Risk   Falls in the past year? 0  Number falls in past yr: 0  Injury with Fall? 0  Risk for fall due to : No Fall Risks  Follow up Falls evaluation completed     Most recent depression screenings:    08/23/2021   12:33 PM 08/19/2021   10:52 AM  PHQ 2/9 Scores  PHQ - 2 Score 4 4  PHQ- 9 Score 14 14        Patient Care Team: Hali Marry, MD as PCP - General (Family Medicine)   Outpatient Medications Prior to Visit  Medication Sig   Cholecalciferol (VITAMIN D-3 PO) Take 1 tablet by mouth daily.   doxepin (SINEQUAN) 10 MG capsule Take 1 capsule (10 mg total) by mouth at bedtime.   escitalopram (LEXAPRO) 5 MG tablet TAKE 1 TABLET (5 MG TOTAL) BY MOUTH DAILY. APPT FOR FURTHER REFILLS   MAGNESIUM PO Take 1 capsule by mouth daily.   Methylcobalamin 5000 MCG TBDP Take by mouth.   No facility-administered medications prior to visit.    ROS        Objective:     BP 114/72   Pulse 71   Ht '5\' 6"'$  (1.676 m)   Wt 121 lb (54.9 kg)   LMP 10/20/2014   SpO2 100%   BMI 19.53 kg/m    Physical Exam Vitals reviewed.  Constitutional:      Appearance: She is well-developed.  HENT:     Head: Normocephalic and atraumatic.     Right Ear: External ear normal.     Left Ear: External ear normal.     Nose: Nose normal.  Eyes:     Conjunctiva/sclera: Conjunctivae normal.     Pupils: Pupils are equal, round, and reactive to light.  Neck:     Thyroid: No  thyromegaly.  Cardiovascular:     Rate and Rhythm: Normal rate and regular rhythm.     Heart sounds: Normal heart sounds.  Pulmonary:     Effort: Pulmonary effort is normal.     Breath sounds: Normal breath sounds. No wheezing.  Abdominal:     General: Bowel sounds are normal.  Musculoskeletal:     Cervical back: Neck supple.  Lymphadenopathy:     Cervical: No cervical adenopathy.  Skin:    General: Skin is warm and dry.     Coloration: Skin is not pale.  Neurological:     General: No focal deficit present.     Mental Status: She is alert and oriented to person, place, and time.  Psychiatric:        Mood and Affect: Mood normal.        Behavior: Behavior normal.      No results found for any visits on 01/20/22.     Assessment & Plan:    Routine Health Maintenance and Physical Exam  Immunization History  Administered Date(s) Administered  Influenza,inj,Quad PF,6+ Mos 01/14/2017, 02/07/2019   Influenza-Unspecified 01/29/2018   PFIZER(Purple Top)SARS-COV-2 Vaccination 06/28/2019, 07/19/2019, 03/07/2020   Tdap 11/11/2010, 01/20/2018   Zoster Recombinat (Shingrix) 01/20/2022    Health Maintenance  Topic Date Due   INFLUENZA VACCINE  07/27/2022 (Originally 11/26/2021)   COVID-19 Vaccine (4 - Pfizer series) 02/06/2023 (Originally 05/02/2020)   Zoster Vaccines- Shingrix (2 of 2) 03/17/2022   MAMMOGRAM  04/17/2022   PAP SMEAR-Modifier  05/01/2022   COLONOSCOPY (Pts 45-75yr Insurance coverage will need to be confirmed)  08/22/2025   TETANUS/TDAP  01/21/2028   Hepatitis C Screening  Completed   HIV Screening  Completed   Pneumococcal Vaccine [age]Years old  Aged Out   HPV VACCINES  Aged Out    Discussed health benefits of physical activity, and encouraged her to engage in regular exercise appropriate for her age and condition.  Problem List Items Addressed This Visit       Other   Depression with anxiety    Feels that the 5 mg Lexapro is been working really well  with really taken the edge off.  Her mom has also moved into a senior care center which has been really helpful and would have the ability to transition into assisted living if needed.  He is going to plan on starting to taper the doxepin for sleep.      Other Visit Diagnoses     Routine general medical examination at a health care facility    -  Primary   Relevant Orders   COMPLETE METABOLIC PANEL WITH GFR   Lipid panel   Hemoglobin A1c   CBC   Screening mammogram for breast cancer       Relevant Orders   MM 3D SCREEN BREAST BILATERAL   Need for Zostavax administration       Relevant Orders   Varicella-zoster vaccine IM (Completed)       Keep up a regular exercise program and make sure you are eating a healthy diet Try to eat 4 servings of dairy a day, or if you are lactose intolerant take a calcium with vitamin D daily.  Your vaccines are up to date.    Return in about 6 months (around 07/21/2022) for Mood management .     CBeatrice Lecher MD

## 2022-02-13 ENCOUNTER — Encounter: Payer: Self-pay | Admitting: Family Medicine

## 2022-02-13 DIAGNOSIS — F418 Other specified anxiety disorders: Secondary | ICD-10-CM

## 2022-02-13 MED ORDER — ESCITALOPRAM OXALATE 10 MG PO TABS: 10.0000 mg | ORAL_TABLET | Freq: Every day | ORAL | 3 refills | Status: DC

## 2022-02-13 NOTE — Telephone Encounter (Signed)
Meds ordered this encounter  Medications   escitalopram (LEXAPRO) 10 MG tablet    Sig: Take 1 tablet (10 mg total) by mouth daily. Appt for further refills    Dispense:  90 tablet    Refill:  3

## 2022-02-13 NOTE — Telephone Encounter (Signed)
Recent note doesn't mention an increase    Depression with anxiety      Feels that the 5 mg Lexapro is been working really well with really taken the edge off.  Her mom has also moved into a senior care center which has been really helpful and would have the ability to transition into assisted living if needed.  He is going to plan on starting to taper the doxepin for sleep.     Please advise.

## 2022-08-05 ENCOUNTER — Telehealth (INDEPENDENT_AMBULATORY_CARE_PROVIDER_SITE_OTHER): Payer: 59 | Admitting: Family Medicine

## 2022-08-05 DIAGNOSIS — D508 Other iron deficiency anemias: Secondary | ICD-10-CM

## 2022-08-05 DIAGNOSIS — F4321 Adjustment disorder with depressed mood: Secondary | ICD-10-CM | POA: Diagnosis not present

## 2022-08-05 DIAGNOSIS — E559 Vitamin D deficiency, unspecified: Secondary | ICD-10-CM | POA: Diagnosis not present

## 2022-08-05 DIAGNOSIS — R634 Abnormal weight loss: Secondary | ICD-10-CM | POA: Diagnosis not present

## 2022-08-05 DIAGNOSIS — E785 Hyperlipidemia, unspecified: Secondary | ICD-10-CM | POA: Diagnosis not present

## 2022-08-05 DIAGNOSIS — F418 Other specified anxiety disorders: Secondary | ICD-10-CM

## 2022-08-05 NOTE — Assessment & Plan Note (Signed)
Due to recheck vitamin D.  

## 2022-08-05 NOTE — Progress Notes (Signed)
Virtual Visit via Video Note  I connected with Laurie Cooper on 08/05/22 at 10:50 AM EDT by a video enabled telemedicine application and verified that I am speaking with the correct person using two identifiers.   I discussed the limitations of evaluation and management by telemedicine and the availability of in person appointments. The patient expressed understanding and agreed to proceed.  Patient location: at home Provider location: in office  Subjective:    CC:   Chief Complaint  Patient presents with   mood    HPI: She is doing okay overall.  Her mother recently passed away so she is just still dealing with her affairs and even her father's affair's who passed away prior.  It has been challenging and difficult.  She has been trying to get family to help her do some small things which has been good.  She is just felt a lot of pressure around it but is trying to find moments of peace in her life.  She feels like she is doing okay with her medication.  She is thought about maybe seeing a counselor but feels like right now she does not have time.  Has lost some weight after having a GI bug about 5 pounds.  And says she has had significant difficulty getting the 5 pounds back on.  She has tried to eat a little bit more but has really struggled  Past medical history, Surgical history, Family history not pertinant except as noted below, Social history, Allergies, and medications have been entered into the medical record, reviewed, and corrections made.    Objective:    General: Speaking clearly in complete sentences without any shortness of breath.  Alert and oriented x3.  Normal judgment. No apparent acute distress.    Impression and Recommendations:    Problem List Items Addressed This Visit       Other   Vitamin D deficiency    Due to recheck vitamin D      Relevant Orders   Lipid Panel w/reflex Direct LDL   COMPLETE METABOLIC PANEL WITH GFR   CBC   VITAMIN D 25  Hydroxy (Vit-D Deficiency, Fractures)   Fe+TIBC+Fer   TSH   Depression with anxiety - Primary    Stable, continue current regimen.  Did encourage her to reach out if at any point she feels like talking with somebody would be helpful.  Otherwise follow back up in about 6 months.      Relevant Orders   Lipid Panel w/reflex Direct LDL   COMPLETE METABOLIC PANEL WITH GFR   CBC   VITAMIN D 25 Hydroxy (Vit-D Deficiency, Fractures)   Fe+TIBC+Fer   TSH   Other Visit Diagnoses     Grief       Other iron deficiency anemia       Relevant Orders   Lipid Panel w/reflex Direct LDL   COMPLETE METABOLIC PANEL WITH GFR   CBC   VITAMIN D 25 Hydroxy (Vit-D Deficiency, Fractures)   Fe+TIBC+Fer   TSH   Weight loss       Relevant Orders   Lipid Panel w/reflex Direct LDL   COMPLETE METABOLIC PANEL WITH GFR   CBC   VITAMIN D 25 Hydroxy (Vit-D Deficiency, Fractures)   Fe+TIBC+Fer   TSH   Hyperlipidemia, unspecified hyperlipidemia type       Relevant Orders   Lipid Panel w/reflex Direct LDL        Abnormal weight loss-sounds like it occurred mostly  after having a GI bug but she is also been under a lot of stress.  Encouraged her to eat more high calorie but healthy foods.  Continue to work on ways to [employer].  Monitor weight.  If not improving over the next several months then please let me know.  Did recommend that we check her thyroid as well as check her iron and check for anemia.  Orders Placed This Encounter  Procedures   Lipid Panel w/reflex Direct LDL   COMPLETE METABOLIC PANEL WITH GFR   CBC   VITAMIN D 25 Hydroxy (Vit-D Deficiency, Fractures)   Fe+TIBC+Fer   TSH    No orders of the defined types were placed in this encounter.    I discussed the assessment and treatment plan with the patient. The patient was provided an opportunity to ask questions and all were answered. The patient agreed with the plan and demonstrated an understanding of the instructions.   The patient  was advised to call back or seek an in-person evaluation if the symptoms worsen or if the condition fails to improve as anticipated.   Nani Gasser, MD

## 2022-08-05 NOTE — Assessment & Plan Note (Addendum)
Stable, continue current regimen.  Did encourage her to reach out if at any point she feels like talking with somebody would be helpful.  Otherwise follow back up in about 6 months.

## 2022-08-05 NOTE — Progress Notes (Signed)
Pt reports that the medication is working well overall. She has been overwhelmed with her parents estate and everyone coming to her about what she is going to do.

## 2022-08-19 DIAGNOSIS — R634 Abnormal weight loss: Secondary | ICD-10-CM | POA: Diagnosis not present

## 2022-08-19 DIAGNOSIS — E559 Vitamin D deficiency, unspecified: Secondary | ICD-10-CM | POA: Diagnosis not present

## 2022-08-19 DIAGNOSIS — E785 Hyperlipidemia, unspecified: Secondary | ICD-10-CM | POA: Diagnosis not present

## 2022-08-19 DIAGNOSIS — F418 Other specified anxiety disorders: Secondary | ICD-10-CM | POA: Diagnosis not present

## 2022-08-19 DIAGNOSIS — D508 Other iron deficiency anemias: Secondary | ICD-10-CM | POA: Diagnosis not present

## 2022-08-20 LAB — COMPLETE METABOLIC PANEL WITH GFR
AG Ratio: 2.2 (calc) (ref 1.0–2.5)
ALT: 15 U/L (ref 6–29)
AST: 14 U/L (ref 10–35)
Albumin: 4.4 g/dL (ref 3.6–5.1)
Alkaline phosphatase (APISO): 63 U/L (ref 37–153)
BUN: 15 mg/dL (ref 7–25)
CO2: 30 mmol/L (ref 20–32)
Calcium: 10 mg/dL (ref 8.6–10.4)
Chloride: 103 mmol/L (ref 98–110)
Creat: 0.66 mg/dL (ref 0.50–1.05)
Globulin: 2 g/dL (calc) (ref 1.9–3.7)
Glucose, Bld: 94 mg/dL (ref 65–99)
Potassium: 4.4 mmol/L (ref 3.5–5.3)
Sodium: 140 mmol/L (ref 135–146)
Total Bilirubin: 0.6 mg/dL (ref 0.2–1.2)
Total Protein: 6.4 g/dL (ref 6.1–8.1)
eGFR: 99 mL/min/{1.73_m2} (ref >=60)

## 2022-08-20 LAB — LIPID PANEL W/REFLEX DIRECT LDL
Cholesterol: 237 mg/dL — ABNORMAL HIGH (ref <200)
HDL: 56 mg/dL (ref >=50)
LDL Cholesterol (Calc): 156 mg/dL (calc) — ABNORMAL HIGH
Non-HDL Cholesterol (Calc): 181 mg/dL (calc) — ABNORMAL HIGH (ref <130)
Total CHOL/HDL Ratio: 4.2 (calc) (ref <5.0)
Triglycerides: 130 mg/dL (ref <150)

## 2022-08-20 LAB — IRON,TIBC AND FERRITIN PANEL
%SAT: 50 % (calc) — ABNORMAL HIGH (ref 16–45)
Ferritin: 42 ng/mL (ref 16–288)
Iron: 147 ug/dL (ref 45–160)
TIBC: 292 mcg/dL (calc) (ref 250–450)

## 2022-08-20 LAB — CBC
HCT: 45.5 % — ABNORMAL HIGH (ref 35.0–45.0)
Hemoglobin: 14.8 g/dL (ref 11.7–15.5)
MCH: 30.2 pg (ref 27.0–33.0)
MCHC: 32.5 g/dL (ref 32.0–36.0)
MCV: 92.9 fL (ref 80.0–100.0)
MPV: 9.8 fL (ref 7.5–12.5)
Platelets: 284 10*3/uL (ref 140–400)
RBC: 4.9 10*6/uL (ref 3.80–5.10)
RDW: 12.5 % (ref 11.0–15.0)
WBC: 5.7 10*3/uL (ref 3.8–10.8)

## 2022-08-20 LAB — TSH: TSH: 0.52 mIU/L (ref 0.40–4.50)

## 2022-08-20 LAB — VITAMIN D 25 HYDROXY (VIT D DEFICIENCY, FRACTURES): Vit D, 25-Hydroxy: 85 ng/mL (ref 30–100)

## 2022-08-20 NOTE — Progress Notes (Signed)
Hi Luddie, LDL cholesterol is still elevated but it does look better so you are able to bring it back down to 156 which is more similar to what it was 2 years ago.  Not quite as good as it was 3 and 4 years ago so just continue to work on [employer] and regular exercise.  Blood count is normal.  Iron stores are okay.  Continue to eat an iron rich diet.  Your metabolic panel looks great.  Vitamin D looks great.  I would looks great as well.  Happy early birthday!

## 2022-10-03 ENCOUNTER — Ambulatory Visit: Payer: 59 | Admitting: Medical-Surgical

## 2022-10-03 ENCOUNTER — Encounter: Payer: Self-pay | Admitting: Medical-Surgical

## 2022-10-03 VITALS — BP 104/62 | HR 86 | Resp 20 | Ht 66.0 in | Wt 118.3 lb

## 2022-10-03 DIAGNOSIS — R0989 Other specified symptoms and signs involving the circulatory and respiratory systems: Secondary | ICD-10-CM

## 2022-10-03 LAB — POC COVID19 BINAXNOW: SARS Coronavirus 2 Ag: NEGATIVE

## 2022-10-03 LAB — POCT INFLUENZA A/B
Influenza A, POC: NEGATIVE
Influenza B, POC: NEGATIVE

## 2022-10-03 MED ORDER — AZITHROMYCIN 250 MG PO TABS: ORAL_TABLET | ORAL | 0 refills | Status: AC

## 2022-10-03 NOTE — Progress Notes (Signed)
        Established patient visit  History, exam, impression, and plan:  1. Respiratory symptoms Pleasant 62 year old female presenting today with complaints of 6 days of respiratory symptoms including Tmax 102.3, nonproductive cough, sinus congestion, rhinorrhea, body aches, and fatigue.  Has recently been exposed to her grandchildren who have been sick with similar symptoms.  Notes her husband was hospitalized last month with pneumonia.  Has been using an over-the-counter antihistamine/cough suppressant at night which is helpful.  Alternating ibuprofen and Tylenol to help with fever and bodyaches.  See below for physical exam.  POCT flu and COVID-negative.  She has had just under a week of symptoms and with recent exposures to illnesses, strongly suspect this is viral in nature.  Recommend symptomatic treatment.  Offered prescription cough suppressant but she would prefer to use over-the-counter.  Continue ibuprofen/Tylenol for fever and discomfort.  We did review that viral illnesses typically last 7 to 10 days before spontaneous resolution/improvement.  As she is nearing a week of symptoms already plan continued symptomatic treatment for the next 2 to 3 days and if no improvement, start azithromycin to cover for secondary bacterial infection. - POCT Influenza A/B - POC COVID-19  Physical Exam Vitals reviewed.  Constitutional:      General: She is not in acute distress.    Appearance: Normal appearance. She is not ill-appearing.  HENT:     Head: Normocephalic and atraumatic.     Right Ear: Ear canal and external ear normal. A middle ear effusion (Clear) is present. There is no impacted cerumen.     Left Ear: Ear canal and external ear normal. A middle ear effusion (Clear) is present. There is no impacted cerumen.  Cardiovascular:     Rate and Rhythm: Normal rate and regular rhythm.     Pulses: Normal pulses.     Heart sounds: Normal heart sounds. No murmur heard.    No friction rub. No  gallop.  Pulmonary:     Effort: Pulmonary effort is normal. No respiratory distress.     Breath sounds: Normal breath sounds. No wheezing.  Skin:    General: Skin is warm and dry.  Neurological:     Mental Status: She is alert and oriented to person, place, and time.  Psychiatric:        Mood and Affect: Mood normal.        Behavior: Behavior normal.        Thought Content: Thought content normal.        Judgment: Judgment normal.   Procedures performed this visit: None.  Return if symptoms worsen or fail to improve.  __________________________________ Thayer Ohm, DNP, APRN, FNP-BC Primary Care and Sports Medicine [facility]

## 2022-10-06 ENCOUNTER — Encounter: Payer: Self-pay | Admitting: Medical-Surgical

## 2022-10-06 MED ORDER — GUAIFENESIN-CODEINE 100-10 MG/5ML PO SOLN: 5.0000 mL | Freq: Four times a day (QID) | ORAL | 0 refills | Status: DC | PRN

## 2022-11-05 ENCOUNTER — Telehealth: Payer: Self-pay | Admitting: *Deleted

## 2022-11-05 NOTE — Telephone Encounter (Signed)
Returned call from 11/04/2022 at 3:27 PM. Left patient a message to call and schedule.

## 2022-11-11 ENCOUNTER — Encounter: Payer: Self-pay | Admitting: Family Medicine

## 2022-11-11 DIAGNOSIS — F418 Other specified anxiety disorders: Secondary | ICD-10-CM

## 2022-11-12 MED ORDER — ESCITALOPRAM OXALATE 10 MG PO TABS: 15.0000 mg | ORAL_TABLET | Freq: Every day | ORAL | 0 refills | Status: DC

## 2022-11-12 NOTE — Telephone Encounter (Signed)
Med sent for 15 mg

## 2022-11-24 ENCOUNTER — Other Ambulatory Visit: Payer: Self-pay | Admitting: Obstetrics & Gynecology

## 2022-11-24 DIAGNOSIS — Z1231 Encounter for screening mammogram for malignant neoplasm of breast: Secondary | ICD-10-CM

## 2022-11-26 ENCOUNTER — Ambulatory Visit (INDEPENDENT_AMBULATORY_CARE_PROVIDER_SITE_OTHER): Payer: 59

## 2022-11-26 DIAGNOSIS — Z1231 Encounter for screening mammogram for malignant neoplasm of breast: Secondary | ICD-10-CM | POA: Diagnosis not present

## 2022-12-01 ENCOUNTER — Encounter: Payer: Self-pay | Admitting: Obstetrics & Gynecology

## 2022-12-01 ENCOUNTER — Other Ambulatory Visit (HOSPITAL_COMMUNITY)
Admission: RE | Admit: 2022-12-01 | Discharge: 2022-12-01 | Disposition: A | Discharge status: Home / Self Care | Payer: 59 | Source: Ambulatory Visit | Attending: Obstetrics & Gynecology | Admitting: Obstetrics & Gynecology

## 2022-12-01 ENCOUNTER — Ambulatory Visit (INDEPENDENT_AMBULATORY_CARE_PROVIDER_SITE_OTHER): Payer: 59 | Admitting: Obstetrics & Gynecology

## 2022-12-01 VITALS — BP 121/78 | HR 68 | Ht 66.0 in | Wt 124.0 lb

## 2022-12-01 DIAGNOSIS — Z01419 Encounter for gynecological examination (general) (routine) without abnormal findings: Secondary | ICD-10-CM

## 2022-12-01 DIAGNOSIS — M810 Age-related osteoporosis without current pathological fracture: Secondary | ICD-10-CM

## 2022-12-01 NOTE — Progress Notes (Signed)
  Subjective:     Laurie Cooper is a 62 y.o. female here for a routine exam.  Current complaints: none.     Gynecologic History Patient's last menstrual period was 10/20/2014. Last Mammogram: 11/26/22 Last Pap Smear:  1/21 Last Colon Screening: 2022 Seat Belts:   yes Sun Screen:   yes Dental Check Up:  yes Brush & Floss:  yes   Obstetric History OB History  Gravida Para Term Preterm AB Living  3 2 2   1 2   SAB IAB Ectopic Multiple Live Births  1            # Outcome Date GA Lbr Len/2nd Weight Sex Type Anes PTL Lv  3 SAB           2 Term      Vag-Spont     1 Term      Vag-Spont        The following portions of the patient's history were reviewed and updated as appropriate: allergies, current medications, past family history, past medical history, past social history, past surgical history, and problem list.  Review of Systems Pertinent items noted in HPI and remainder of comprehensive ROS otherwise negative.    Objective:   Vitals:   12/01/22 0800  BP: 121/78  Pulse: 68  Weight: 124 lb (56.2 kg)  Height: 5\' 6"  (1.676 m)   Vitals:  WNL General appearance: alert, cooperative and no distress  HEENT: Normocephalic, without obvious abnormality, atraumatic Eyes: negative Throat: lips, mucosa, and tongue normal; teeth and gums normal  Respiratory: Clear to auscultation bilaterally  CV: Regular rate and rhythm  Breasts:  Normal appearance, no masses or tenderness, no nipple retraction or dimpling  GI: Soft, non-tender; bowel sounds normal; no masses,  no organomegaly  GU: External Genitalia:  Tanner V, no lesion Urethra:  No prolapse   Vagina: Pink, normal rugae, no blood or discharge  Cervix: No CMT, no lesion  Uterus:  Normal size and contour, non tender  Adnexa: Normal, no masses, non tender  Musculoskeletal: No edema, redness or tenderness in the calves or thighs  Skin: No lesions or rash  Lymphatic: Axillary adenopathy: none     Psychiatric: Normal mood and  behavior      Assessment:    Healthy female exam.    Plan:    Pap with co testing Mammogram and colonoscopy up to date Rpt dexa next May and continue calcium and vit d Yoga, stationary bike and waling 3-4 miles a day.

## 2023-02-14 ENCOUNTER — Other Ambulatory Visit: Payer: Self-pay | Admitting: Family Medicine

## 2023-02-14 DIAGNOSIS — F418 Other specified anxiety disorders: Secondary | ICD-10-CM

## 2023-03-05 ENCOUNTER — Ambulatory Visit: Payer: 59 | Admitting: Family Medicine

## 2023-03-05 ENCOUNTER — Encounter: Payer: Self-pay | Admitting: Family Medicine

## 2023-03-05 VITALS — BP 95/53 | HR 58 | Ht 66.0 in | Wt 127.0 lb

## 2023-03-05 DIAGNOSIS — L299 Pruritus, unspecified: Secondary | ICD-10-CM | POA: Diagnosis not present

## 2023-03-05 DIAGNOSIS — Z23 Encounter for immunization: Secondary | ICD-10-CM | POA: Diagnosis not present

## 2023-03-05 DIAGNOSIS — F418 Other specified anxiety disorders: Secondary | ICD-10-CM

## 2023-03-05 DIAGNOSIS — E785 Hyperlipidemia, unspecified: Secondary | ICD-10-CM | POA: Diagnosis not present

## 2023-03-05 DIAGNOSIS — R140 Abdominal distension (gaseous): Secondary | ICD-10-CM | POA: Diagnosis not present

## 2023-03-05 MED ORDER — ESCITALOPRAM OXALATE 10 MG PO TABS: 10.0000 mg | ORAL_TABLET | Freq: Every day | ORAL | 2 refills | Status: DC

## 2023-03-05 NOTE — Progress Notes (Signed)
Pt would like to have labs done. Thyroid, lipid and A1c. She was advised that she had labs done earlier this year.

## 2023-03-05 NOTE — Assessment & Plan Note (Signed)
Last LDL was elevated at 156.  She has been eating healthy and trying to exercise.  Will recheck levels today.  The 10-year ASCVD risk score (Arnett DK, et al., 2019) is: 3.1%   Values used to calculate the score:     Age: 62 years     Sex: Female     Is Non-Hispanic African American: No     Diabetic: No     Tobacco smoker: No     Systolic Blood Pressure: 95 mmHg     Is BP treated: No     HDL Cholesterol: 56 mg/dL     Total Cholesterol: 237 mg/dL

## 2023-03-05 NOTE — Assessment & Plan Note (Signed)
Be with her current regimen of Lexapro continue current regimen and follow-up in 6 to 8 months.

## 2023-03-05 NOTE — Progress Notes (Signed)
Established Patient Office Visit  Subjective   Patient ID: Laurie Cooper, female    DOB: 1960/09/30  Age: 62 y.o. MRN: [id number]  Chief Complaint  Patient presents with   Medical Management of Chronic Issues    HPI Has gained back the weight she had lost.  Says now she actually weighs a little more than she would like.  But she feels that she is eating really healthy and exercising.  She just feels really bloated by the end of the day.  Would like to have her thyroid checked.  Feels like mood is much better.  She says she actually does want a get out of bed and feels excited to do things for the day.  She is still dealing with her mom's estate right now and so wants to continue with the medications for at least a while but does not want to be on it forever.  Is also had some bilateral ear itching she wonders if it could be menopause she had read that maybe the lack of estrogen could trigger it.     ROS    Objective:     BP (!) 95/53   Pulse (!) 58   Ht 5\' 6"  (1.676 m)   Wt 127 lb (57.6 kg)   LMP 10/20/2014   SpO2 98%   BMI 20.50 kg/m    Physical Exam   No results found for any visits on 03/05/23.    The 10-year ASCVD risk score (Arnett DK, et al., 2019) is: 3.1%    Assessment & Plan:   Problem List Items Addressed This Visit       Other   Hyperlipidemia LDL goal <130    Last LDL was elevated at 156.  She has been eating healthy and trying to exercise.  Will recheck levels today.  The 10-year ASCVD risk score (Arnett DK, et al., 2019) is: 3.1%   Values used to calculate the score:     Age: 47 years     Sex: Female     Is Non-Hispanic African American: No     Diabetic: No     Tobacco smoker: No     Systolic Blood Pressure: 95 mmHg     Is BP treated: No     HDL Cholesterol: 56 mg/dL     Total Cholesterol: 237 mg/dL       Relevant Orders   CMP14+EGFR   Lipid panel   CBC   TSH   Depression with anxiety    Be with her current regimen of Lexapro  continue current regimen and follow-up in 6 to 8 months.      Relevant Medications   escitalopram (LEXAPRO) 10 MG tablet   Other Relevant Orders   CMP14+EGFR   Lipid panel   CBC   TSH   Other Visit Diagnoses     Bloating    -  Primary   Relevant Orders   CMP14+EGFR   Lipid panel   CBC   TSH   Hyperlipidemia, unspecified hyperlipidemia type       Relevant Orders   CMP14+EGFR   Lipid panel   CBC   TSH   Ear itching           Ear itching-we discussed that it could be allergies recommend a trial of an antihistamine for a week to see if it is helpful.  I really did not see a lot of scale or flaking to indicate dermatitis.  But we could consider Derm  otic if it continues.  There was very little wax on the canal itself so did encourage her to clean less frequently and allow a little bit of wax buildup which helps coat and protect the sensitive skin inside the ears.  Return in about 7 months (around 09/23/2023) for Mood.    Nani Gasser, MD

## 2023-03-06 LAB — CMP14+EGFR
ALT: 13 [IU]/L (ref 0–32)
AST: 18 [IU]/L (ref 0–40)
Albumin: 4.4 g/dL (ref 3.9–4.9)
Alkaline Phosphatase: 86 [IU]/L (ref 44–121)
BUN/Creatinine Ratio: 16 (ref 12–28)
BUN: 12 mg/dL (ref 8–27)
Bilirubin Total: 0.5 mg/dL (ref 0.0–1.2)
CO2: 23 mmol/L (ref 20–29)
Calcium: 9.6 mg/dL (ref 8.7–10.3)
Chloride: 101 mmol/L (ref 96–106)
Creatinine, Ser: 0.74 mg/dL (ref 0.57–1.00)
Globulin, Total: 2 g/dL (ref 1.5–4.5)
Glucose: 91 mg/dL (ref 70–99)
Potassium: 4.7 mmol/L (ref 3.5–5.2)
Sodium: 138 mmol/L (ref 134–144)
Total Protein: 6.4 g/dL (ref 6.0–8.5)
eGFR: 90 mL/min/{1.73_m2} (ref >59)

## 2023-03-06 LAB — CBC
Hematocrit: 44.6 % (ref 34.0–46.6)
Hemoglobin: 14.4 g/dL (ref 11.1–15.9)
MCH: 30.1 pg (ref 26.6–33.0)
MCHC: 32.3 g/dL (ref 31.5–35.7)
MCV: 93 fL (ref 79–97)
Platelets: 284 10*3/uL (ref 150–450)
RBC: 4.79 x10E6/uL (ref 3.77–5.28)
RDW: 12.3 % (ref 11.7–15.4)
WBC: 5 10*3/uL (ref 3.4–10.8)

## 2023-03-06 LAB — LIPID PANEL
Chol/HDL Ratio: 3.5 ratio (ref 0.0–4.4)
Cholesterol, Total: 231 mg/dL — ABNORMAL HIGH (ref 100–199)
HDL: 66 mg/dL (ref >39)
LDL Chol Calc (NIH): 149 mg/dL — ABNORMAL HIGH (ref 0–99)
Triglycerides: 93 mg/dL (ref 0–149)
VLDL Cholesterol Cal: 16 mg/dL (ref 5–40)

## 2023-03-06 LAB — TSH: TSH: 0.708 u[IU]/mL (ref 0.450–4.500)

## 2023-03-06 NOTE — Progress Notes (Signed)
Hi Laurie Cooper, total cholesterol and LDL are elevated just encouraged her to continue to work on healthy diet and regular exercise.  I suspect some of it is genetic but it is still a risk factor for heart disease.  Your blood count and metabolic panel, and thyroid look great.

## 2023-04-02 ENCOUNTER — Other Ambulatory Visit: Payer: Self-pay | Admitting: Family Medicine

## 2023-04-02 DIAGNOSIS — F418 Other specified anxiety disorders: Secondary | ICD-10-CM

## 2023-04-06 ENCOUNTER — Encounter: Payer: Self-pay | Admitting: Family Medicine

## 2023-11-03 ENCOUNTER — Other Ambulatory Visit: Payer: Self-pay | Admitting: Obstetrics & Gynecology

## 2023-11-03 DIAGNOSIS — Z1231 Encounter for screening mammogram for malignant neoplasm of breast: Secondary | ICD-10-CM

## 2023-11-06 ENCOUNTER — Telehealth: Payer: Self-pay | Admitting: *Deleted

## 2023-11-06 NOTE — Telephone Encounter (Signed)
 Returned call from 11:17 AM. Left patient a message to call and schedule annual with Dr. Cris for after 12/01/2023.

## 2023-11-27 ENCOUNTER — Encounter: Payer: Self-pay | Admitting: Obstetrics & Gynecology

## 2023-12-02 ENCOUNTER — Ambulatory Visit

## 2023-12-02 DIAGNOSIS — Z1231 Encounter for screening mammogram for malignant neoplasm of breast: Secondary | ICD-10-CM | POA: Diagnosis not present

## 2023-12-10 ENCOUNTER — Encounter: Payer: Self-pay | Admitting: Family Medicine

## 2023-12-14 ENCOUNTER — Ambulatory Visit: Payer: Self-pay

## 2023-12-14 ENCOUNTER — Ambulatory Visit (INDEPENDENT_AMBULATORY_CARE_PROVIDER_SITE_OTHER): Admitting: Urgent Care

## 2023-12-14 ENCOUNTER — Encounter: Payer: Self-pay | Admitting: Urgent Care

## 2023-12-14 VITALS — BP 108/71 | HR 61 | Ht 66.0 in | Wt 132.8 lb

## 2023-12-14 DIAGNOSIS — B86 Scabies: Secondary | ICD-10-CM | POA: Diagnosis not present

## 2023-12-14 MED ORDER — PERMETHRIN 5 % EX CREA: 1.0000 | TOPICAL_CREAM | Freq: Once | CUTANEOUS | 3 refills | Status: AC

## 2023-12-14 NOTE — Progress Notes (Signed)
 Established Patient Office Visit  Subjective:  Patient ID: Laurie Cooper, female    DOB: Mar 09, 1961  Age: 63 y.o. MRN: [id number]  Chief Complaint  Patient presents with   Rash    Pateint c/o pruritic  rash upper torso and bilateral thighs x 10 days but worse for the last 2 to 3 days.  Patient has not started any new soaps, detergents, shampoos... etc. She did take an abx and new probiotic around July 15th after dental surgery.     HPI   Discussed the use of AI scribe software for clinical note transcription with the patient, who gave verbal consent to proceed.  History of Present Illness   Laurie Cooper is a 63 year old female who presents with a rash that started at the hairline.  The rash began approximately ten days ago at the hairline and has since spread to other areas. It is described as itchy dots resembling hives. There has been no association with new activities or exposures, such as being in tall grass, and no one else in her household has developed a similar rash.  There is no involvement of the hands or feet, and no pain in these areas. Her husband has checked for lice. She has a history of dealing with lice in the past, which was treated with mayonnaise and saran wrap.  There is no family history of similar rashes, and her husband does not have a rash. She occasionally sleeps in the same bed as her husband, but sometimes she sleeps separately.  She has not used any specific treatments for this rash before and has no known history of scabies.      Patient Active Problem List   Diagnosis Date Noted   Osteoporosis 09/10/2021   Depression with anxiety 08/23/2021   Coccydynia 04/26/2019   Family history of ovarian cancer 06/25/2016   Hyperlipidemia LDL goal <130 11/09/2014   Atypical glandular cells on cervical Pap smear 04/14/2012   Vitamin D  deficiency 05/27/2010   Past Medical History:  Diagnosis Date   Kidney stone    Ovarian cyst    Tennis elbow    Past  Surgical History:  Procedure Laterality Date   DILATATION & CURETTAGE/HYSTEROSCOPY WITH MYOSURE N/A 12/26/2016   Procedure: DILATATION & CURETTAGE/HYSTEROSCOPY WITH MYOSURE;  Surgeon: Cris Burnard DEL, MD;  Location: [facility];  Service: Gynecology;  Laterality: N/A;  removal of polyp REP will be here confirmed on 12/23/16   ROTATOR CUFF REPAIR     left   TONSILLECTOMY AND ADENOIDECTOMY     Social History   Tobacco Use   Smoking status: Never   Smokeless tobacco: Never  Vaping Use   Vaping status: Never Used  Substance Use Topics   Alcohol use: Yes    Comment: rarely   Drug use: No      ROS: as noted in HPI  Objective:     BP 108/71   Pulse 61   Ht 5' 6 (1.676 m)   Wt 132 lb 12 oz (60.2 kg)   LMP 10/20/2014   SpO2 99%   BMI 21.43 kg/m  BP Readings from Last 3 Encounters:  12/14/23 108/71  03/05/23 (!) 95/53  12/01/22 121/78   Wt Readings from Last 3 Encounters:  12/14/23 132 lb 12 oz (60.2 kg)  03/05/23 127 lb (57.6 kg)  12/01/22 124 lb (56.2 kg)      Physical Exam Vitals and nursing note reviewed.  Constitutional:      General: She  is not in acute distress.    Appearance: Normal appearance. She is not ill-appearing, toxic-appearing or diaphoretic.  HENT:     Head: Normocephalic and atraumatic.  Eyes:     General: No scleral icterus.       Right eye: No discharge.        Left eye: No discharge.     Extraocular Movements: Extraocular movements intact.     Pupils: Pupils are equal, round, and reactive to light.  Cardiovascular:     Rate and Rhythm: Normal rate.  Pulmonary:     Effort: Pulmonary effort is normal. No respiratory distress.  Skin:    General: Skin is warm and dry.     Coloration: Skin is not jaundiced.     Findings: Rash (papular rash with burrowing lines and excoriations primarily on back, extending up neck into hairline.) present. No bruising or erythema.  Neurological:     General: No focal deficit present.     Mental Status: She is  alert and oriented to person, place, and time.     Gait: Gait normal.  Psychiatric:        Mood and Affect: Mood normal.        Behavior: Behavior normal.      No results found for any visits on 12/14/23.  Last CBC Lab Results  Component Value Date   WBC 5.0 03/05/2023   HGB 14.4 03/05/2023   HCT 44.6 03/05/2023   MCV 93 03/05/2023   MCH 30.1 03/05/2023   RDW 12.3 03/05/2023   PLT 284 03/05/2023   Last metabolic panel Lab Results  Component Value Date   GLUCOSE 91 03/05/2023   NA 138 03/05/2023   K 4.7 03/05/2023   CL 101 03/05/2023   CO2 23 03/05/2023   BUN 12 03/05/2023   CREATININE 0.74 03/05/2023   EGFR 90 03/05/2023   CALCIUM 9.6 03/05/2023   PROT 6.4 03/05/2023   ALBUMIN 4.4 03/05/2023   LABGLOB 2.0 03/05/2023   BILITOT 0.5 03/05/2023   ALKPHOS 86 03/05/2023   AST 18 03/05/2023   ALT 13 03/05/2023   Last lipids Lab Results  Component Value Date   CHOL 231 (H) 03/05/2023   HDL 66 03/05/2023   LDLCALC 149 (H) 03/05/2023   TRIG 93 03/05/2023   CHOLHDL 3.5 03/05/2023   Last hemoglobin A1c No results found for: HGBA1C    The 10-year ASCVD risk score (Arnett DK, et al., 2019) is: 4%  Assessment & Plan:  Scabies -     Permethrin ; Apply 1 Application topically once for 1 dose. Apply to entire body from neck to feet and leave on overnight (12 hours), avoid face.  Dispense: 60 g; Refill: 3  Assessment and Plan    Scabies Suspected scabies due to itchy papules at hairline, differential included dermatitis and lice. No hand or foot involvement. Explained scabies is unrelated to cleanliness and can result from contact with infested areas. - Prescribed permethrin  cream, apply to entire body excluding face and sensitive areas, leave on for 12 hours overnight. - Massage permethrin  into scalp, avoiding hair. - Advise treating bedding and clothing by sealing in black trash bags and exposing to sun for heat treatment. - Provide refill for permethrin  for  potential second treatment in seven days. - Advise avoiding contact with grandchildren until treatment completion. - Attach educational material about scabies to after-visit summary. - Instruct to contact office if no significant improvement in rash within 3-4 days.  No follow-ups on file.   Benton LITTIE Gave, PA

## 2023-12-14 NOTE — Telephone Encounter (Signed)
 FYI Only or Action Required?: Action required by provider: request for appointment.  Patient was last seen in primary care on 03/05/2023 by Alvan Dorothyann BIRCH, MD.  Called Nurse Triage reporting Rash.  Symptoms began a week ago.  Interventions attempted: OTC medications: zyrtec.  Symptoms are: gradually worsening.  Triage Disposition: See Physician Within 24 Hours  Patient/caregiver understands and will follow disposition?: YesCopied from CRM #[id number]. Topic: Clinical - Red Word Triage >> Dec 14, 2023  9:37 AM Susanna ORN wrote: Red Word that prompted transfer to Nurse Triage: Patient states she had a rash last week and spoke with Dr. Alvan via MyChart and she told her to get Zyrtec. Patient states it has been a rough weekend and now the rash has spread all over. States she wants to make an appt to verify if they are hives or not. Reason for Disposition  SEVERE itching (i.e., interferes with sleep, normal activities or school)  Answer Assessment - Initial Assessment Questions Started on neck 10 days ago. Pt tried zyrtec and rash is spreading.      1. APPEARANCE of RASH: What does the rash look like? (e.g., blisters, dry flaky skin, red spots, redness, sores)     Red patches with dots 2. SIZE: How big are the spots? (e.g., tip of pen, eraser, coin; inches, centimeters)     na 3. LOCATION: Where is the rash located?     Back, sides, chest area 4. COLOR: What color is the rash? (Note: It is difficult to assess rash color in people with darker-colored skin. When this situation occurs, simply ask the caller to describe what they see.)     red 5. ONSET: When did the rash begin?     10 days ago  6. FEVER: Do you have a fever? If Yes, ask: What is your temperature, how was it measured, and when did it start?     denies 7. ITCHING: Does the rash itch? If Yes, ask: How bad is the itch? (Scale 1-10; or mild, moderate, severe)     severe 8. CAUSE: What do you think  is causing the rash?     Not sure 9. MEDICINE FACTORS: Have you started any new medicines within the last 2 weeks? (e.g., antibiotics)      na 10. OTHER SYMPTOMS: Do you have any other symptoms? (e.g., dizziness, headache, sore throat, joint pain)       denies  Protocols used: Rash or Redness - [facility]

## 2023-12-14 NOTE — Telephone Encounter (Signed)
 Appointment today

## 2023-12-14 NOTE — Patient Instructions (Addendum)
 Please apply topical permethrin  on your entire body, including scalp, EXCEPT for the face. Lather, leave on for 12 hours, then rinse.  Gather all bed sheets, clothes and towels and put in black trash bags outside. Allow the sun to kill the mites. You can buy OTC Nix products to spray on bedding and furniture. If no improvement in 3-4 days, please notify our office.  It is uncommon, but a second treatment is sometimes needed 7 days after the first. Avoid topical steroid creams.

## 2023-12-16 DIAGNOSIS — H52203 Unspecified astigmatism, bilateral: Secondary | ICD-10-CM | POA: Diagnosis not present

## 2023-12-21 ENCOUNTER — Ambulatory Visit: Admitting: Obstetrics & Gynecology

## 2023-12-21 ENCOUNTER — Ambulatory Visit: Payer: Self-pay

## 2023-12-21 DIAGNOSIS — L309 Dermatitis, unspecified: Secondary | ICD-10-CM

## 2023-12-21 MED ORDER — PREDNISONE 50 MG PO TABS
ORAL_TABLET | ORAL | 0 refills | Status: DC
Start: 2023-12-21 — End: 2024-01-25

## 2023-12-21 MED ORDER — HYDROXYZINE PAMOATE 25 MG PO CAPS: 25.0000 mg | ORAL_CAPSULE | Freq: Three times a day (TID) | ORAL | 0 refills | Status: DC | PRN

## 2023-12-21 NOTE — Telephone Encounter (Signed)
 FYI Only or Action Required?: Action required by provider: states rash is spreading and wants to see if something else could be called in. Also wants to know if husband needs to be checked and started on meds.  Patient was last seen in primary care on 12/14/2023 by Lowella Benton CROME, PA.  Called Nurse Triage reporting Rash.  Symptoms began several weeks ago.  Interventions attempted: Prescription medications:  SABRA  Symptoms are: gradually worsening.  Triage Disposition: See Physician Within 24 Hours  Patient/caregiver understands and will follow disposition?: No, wishes to speak with PCP     Copied from CRM #[id number]. Topic: Clinical - Red Word Triage >> Dec 21, 2023 11:11 AM Laurie Cooper wrote: Patient would like to speak to a nurse because she still has and itchy rash. Patient called in last week about this rash and it has not gotten better. Was seen by Benton Lowella, and was prescribed medication, but rash is still spreading. Reason for Disposition  SEVERE itching (i.e., interferes with sleep, normal activities or school)  Answer Assessment - Initial Assessment Questions 1. APPEARANCE of RASH: What does the rash look like? (e.g., blisters, dry flaky skin, red spots, redness, sores)     Was seen last week for this, knees to her neck 2. SIZE: How big are the spots? (e.g., tip of pen, eraser, coin; inches, centimeters)     States area is larger now 3. LOCATION: Where is the rash located?     See above 4. COLOR: What color is the rash? (Note: It is difficult to assess rash color in people with darker-colored skin. When this situation occurs, simply ask the caller to describe what they see.)     States looks like acne 5. ONSET: When did the rash begin?     Week before last 6. FEVER: Do you have a fever? If Yes, ask: What is your temperature, how was it measured, and when did it start?     no 7. ITCHING: Does the rash itch? If Yes, ask: How bad is the itch? (Scale 1-10;  or mild, moderate, severe)     Yes, seveere 8. CAUSE: What do you think is causing the rash?     unknown 9. MEDICINE FACTORS: Have you started any new medicines within the last 2 weeks? (e.g., antibiotics)      Yes, was told it was scabies 10. OTHER SYMPTOMS: Do you have any other symptoms? (e.g., dizziness, headache, sore throat, joint pain)       denies 11. PREGNANCY: Is there any chance you are pregnant? When was your last menstrual period?       na  Protocols used: Rash or Redness - [facility]

## 2023-12-22 NOTE — Telephone Encounter (Signed)
 Ok to double book her and I can look at her rash.

## 2023-12-23 ENCOUNTER — Ambulatory Visit (INDEPENDENT_AMBULATORY_CARE_PROVIDER_SITE_OTHER)

## 2023-12-23 DIAGNOSIS — Z780 Asymptomatic menopausal state: Secondary | ICD-10-CM | POA: Diagnosis not present

## 2023-12-23 DIAGNOSIS — M810 Age-related osteoporosis without current pathological fracture: Secondary | ICD-10-CM | POA: Diagnosis not present

## 2023-12-23 NOTE — Telephone Encounter (Signed)
 Spoke with patient. States the  are no new bumps yesterday or today.  Still itchy but Benadaryl and Zyrtec helps this- seems to have turned a corner. She does have refill of the cream given if needed.  If symptoms return or worsen she will give our office a call to get this evaluated again.

## 2024-01-05 DIAGNOSIS — L821 Other seborrheic keratosis: Secondary | ICD-10-CM | POA: Diagnosis not present

## 2024-01-05 DIAGNOSIS — R21 Rash and other nonspecific skin eruption: Secondary | ICD-10-CM | POA: Diagnosis not present

## 2024-01-05 DIAGNOSIS — L815 Leukoderma, not elsewhere classified: Secondary | ICD-10-CM | POA: Diagnosis not present

## 2024-01-05 DIAGNOSIS — L814 Other melanin hyperpigmentation: Secondary | ICD-10-CM | POA: Diagnosis not present

## 2024-01-25 ENCOUNTER — Ambulatory Visit (INDEPENDENT_AMBULATORY_CARE_PROVIDER_SITE_OTHER): Admitting: Obstetrics & Gynecology

## 2024-01-25 ENCOUNTER — Encounter: Payer: Self-pay | Admitting: Obstetrics & Gynecology

## 2024-01-25 VITALS — BP 121/73 | HR 60 | Ht 66.0 in | Wt 134.0 lb

## 2024-01-25 DIAGNOSIS — M810 Age-related osteoporosis without current pathological fracture: Secondary | ICD-10-CM

## 2024-01-25 DIAGNOSIS — Z01419 Encounter for gynecological examination (general) (routine) without abnormal findings: Secondary | ICD-10-CM | POA: Diagnosis not present

## 2024-01-25 NOTE — Progress Notes (Signed)
 L Subjective:     Laurie Cooper is a 63 y.o. female here for a routine exam.  Current complaints: gained some weight over the summer--BMI is 21.  In past 3 years, she lost both parents, husband had heart scare (cardiomyopathy), and daughter broke off engagement and has a girlfriend.  Pt sees an Dentist and is satisfied that BMD did not change.  She does not want to go on osteoporosis drugs at this time.     Gynecologic History Patient's last menstrual period was 10/20/2014. Contraception: post menopausal status Last pap smear (date and result):12/01/2022  NILM Last mammogram (date and result):12/02/2023 BI-[facility] Category 1-Negative Last colon screening (date and result):08/22/2020 Brush:yes Floss:yes Seatbelts: yes Sunscreen: yes  Obstetric History OB History  Gravida Para Term Preterm AB Living  3 2 2  1 2   SAB IAB Ectopic Multiple Live Births  1    2    # Outcome Date GA Lbr Len/2nd Weight Sex Type Anes PTL Lv  3 SAB           2 Term      Vag-Spont     1 Term      Vag-Spont        The following portions of the patient's history were reviewed and updated as appropriate: allergies, current medications, past family history, past medical history, past social history, past surgical history, and problem list.  Review of Systems Pertinent items noted in HPI and remainder of comprehensive ROS otherwise negative.    Objective:     Vitals:   01/25/24 1504  BP: 121/73  Pulse: 60  Weight: 134 lb (60.8 kg)  Height: 5' 6 (1.676 m)   Vitals:  WNL General appearance: alert, cooperative and no distress  HEENT: Normocephalic, without obvious abnormality, atraumatic Eyes: negative Throat: lips, mucosa, and tongue normal; teeth and gums normal  Respiratory: Clear to auscultation bilaterally  CV: Regular rate and rhythm  Breasts:  Normal appearance, no masses or tenderness, no nipple retraction or dimpling  GI: Soft, non-tender; bowel sounds normal; no masses,  no organomegaly  GU:  External Genitalia:  Tanner V, no lesion Urethra:  No prolapse   Vagina: Pink, normal rugae, no blood or discharge  Cervix: No CMT, no lesion  Uterus:  Normal size and contour, non tender  Adnexa: Normal, no masses, non tender  Musculoskeletal: No edema, redness or tenderness in the calves or thighs  Skin: No lesions or rash  Lymphatic: Axillary adenopathy: none     Psychiatric: Normal mood and behavior       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    Plan:   Pap, mammo and colonoscopy are up to date Bone Health--Continue to maximize Ca++, Vit D and weight bearing exercise.  If she changes her mind about starting Rx for osteoporosis, she can message us .  Will continue to yearly vulvar exams (see picture above.

## 2024-02-17 DIAGNOSIS — K080 Exfoliation of teeth due to systemic causes: Secondary | ICD-10-CM | POA: Diagnosis not present

## 2024-02-28 ENCOUNTER — Ambulatory Visit: Payer: Self-pay | Admitting: Obstetrics & Gynecology

## 2024-03-08 ENCOUNTER — Other Ambulatory Visit: Payer: Self-pay | Admitting: Family Medicine

## 2024-03-08 DIAGNOSIS — F418 Other specified anxiety disorders: Secondary | ICD-10-CM

## 2024-03-10 ENCOUNTER — Ambulatory Visit

## 2024-04-12 DIAGNOSIS — K080 Exfoliation of teeth due to systemic causes: Secondary | ICD-10-CM | POA: Diagnosis not present

## 2024-05-10 ENCOUNTER — Ambulatory Visit (INDEPENDENT_AMBULATORY_CARE_PROVIDER_SITE_OTHER): Admitting: Family Medicine

## 2024-05-10 ENCOUNTER — Encounter: Payer: Self-pay | Admitting: Family Medicine

## 2024-05-10 VITALS — BP 110/60 | HR 67 | Ht 66.0 in | Wt 132.0 lb

## 2024-05-10 DIAGNOSIS — F418 Other specified anxiety disorders: Secondary | ICD-10-CM

## 2024-05-10 DIAGNOSIS — Z833 Family history of diabetes mellitus: Secondary | ICD-10-CM | POA: Insufficient documentation

## 2024-05-10 DIAGNOSIS — E785 Hyperlipidemia, unspecified: Secondary | ICD-10-CM

## 2024-05-10 DIAGNOSIS — Z0000 Encounter for general adult medical examination without abnormal findings: Secondary | ICD-10-CM | POA: Diagnosis not present

## 2024-05-10 DIAGNOSIS — M810 Age-related osteoporosis without current pathological fracture: Secondary | ICD-10-CM

## 2024-05-10 NOTE — Patient Instructions (Signed)
 Ms. Laurie Cooper,  Thank you for taking the time for your Medicare Wellness Visit. I appreciate your continued commitment to your health goals. Please review the care plan we discussed, and feel free to reach out if I can assist you further.  Please note that Annual Wellness Visits do not include a physical exam. Some assessments may be limited, especially if the visit was conducted virtually. If needed, we may recommend an in-person follow-up with your provider.  Ongoing Care Seeing your primary care provider every 3 to 6 months helps us  monitor your health and provide consistent, personalized care.   Referrals If a referral was made during today's visit and you haven't received any updates within two weeks, please contact the referred provider directly to check on the status.  Recommended Screenings:  Health Maintenance  Topic Date Due   Pneumococcal Vaccine for age over 82 (1 of 1 - PCV) Never done   COVID-19 Vaccine (7 - 2025-26 season) 09/26/2024   Medicare Annual Wellness Visit  05/10/2025   Colon Cancer Screening  08/22/2025   Breast Cancer Screening  12/01/2025   Pap with HPV screening  12/01/2027   DTaP/Tdap/Td vaccine (3 - Td or Tdap) 01/21/2028   Flu Shot  Completed   Osteoporosis screening with Bone Density Scan  Completed   Hepatitis C Screening  Completed   HIV Screening  Completed   Zoster (Shingles) Vaccine  Completed   Hepatitis B Vaccine  Aged Out   Meningitis B Vaccine  Aged Out       01/20/2022   10:17 AM  Advanced Directives  Does Patient Have a Medical Advance Directive? Yes  Type of Advance Directive Living will;Healthcare Power of Attorney  Does patient want to make changes to medical advance directive? No - Patient declined  Copy of Healthcare Power of Attorney in Chart? No - copy requested    Vision: Annual vision screenings are recommended for early detection of glaucoma, cataracts, and diabetic retinopathy. These exams can also reveal signs of chronic  conditions such as diabetes and high blood pressure.  Dental: Annual dental screenings help detect early signs of oral cancer, gum disease, and other conditions linked to overall health, including heart disease and diabetes.

## 2024-05-10 NOTE — Progress Notes (Signed)
 "  Chief Complaint  Patient presents with   welcome to medicare     Subjective:   Laurie Cooper is a 64 y.o. female who presents for a Welcome to Medicare Exam.   Visit info / Clinical Intake: Living arrangements:: (Patient-Rptd) lives with spouse/significant other Patient's Overall Health Status Rating: (Patient-Rptd) very good Typical amount of pain: (Patient-Rptd) some Does pain affect daily life?: (Patient-Rptd) no  Dietary Habits and Nutritional Risks How many meals a day?: (Patient-Rptd) 3 Eats fruit and vegetables daily?: (Patient-Rptd) yes Most meals are obtained by: (Patient-Rptd) preparing own meals  Functional Status Activities of Daily Living (to include ambulation/medication): (Patient-Rptd) Independent Ambulation: (Patient-Rptd) Independent Medication Administration: (Patient-Rptd) Independent Home Management (perform basic housework or laundry): (Patient-Rptd) Independent Manage your own finances?: (Patient-Rptd) yes Primary transportation is: (Patient-Rptd) driving  Fall Screening Falls in the past year?: 0 Number of falls in past year: 0 Was there an injury with Fall?: 0 Fall Risk Category Calculator: 0 Patient Fall Risk Level: Low Fall Risk  Fall Risk Patient at Risk for Falls Due to: No Fall Risks Fall risk Follow up: Falls evaluation completed  Home and Transportation Safety: All rugs have non-skid backing?: (!) (Patient-Rptd) no All stairs or steps have railings?: (Patient-Rptd) yes Grab bars in the bathtub or shower?: (!) (Patient-Rptd) no Have non-skid surface in bathtub or shower?: (Patient-Rptd) yes Good home lighting?: (Patient-Rptd) yes Regular seat belt use?: (Patient-Rptd) yes Hospital stays in the last year:: (Patient-Rptd) no  Cognitive Assessment Difficulty concentrating, remembering, or making decisions? : (Patient-Rptd) no What year is it?: 0 points What month is it?: 0 points Give patient an address phrase to remember (5  components): repeated and recalled About what time is it?: 0 points Count backwards from 20 to 1: 0 points Say the months of the year in reverse: 0 points Repeat the address phrase from earlier: 0 points 6 CIT Score: 0 points    Allergies (verified) Patient has no known allergies.   Current Medications (verified) Outpatient Encounter Medications as of 05/10/2024  Medication Sig   BORON PO Take 1 tablet by mouth daily.   CALCIUM PO Take by mouth.   Cholecalciferol (VITAMIN D -3 PO) Take 1 tablet by mouth daily.   escitalopram  (LEXAPRO ) 10 MG tablet TAKE 1 TABLET (10 MG TOTAL) BY MOUTH DAILY. APPT FOR FURTHER REFILLS   MAGNESIUM PO Take 1 capsule by mouth daily.   [DISCONTINUED] Methylcobalamin 5000 MCG TBDP Take by mouth. (Patient not taking: Reported on 01/25/2024)   No facility-administered encounter medications on file as of 05/10/2024.    History: Past Medical History:  Diagnosis Date   Kidney stone    Ovarian cyst    Tennis elbow    Past Surgical History:  Procedure Laterality Date   DILATATION & CURETTAGE/HYSTEROSCOPY WITH MYOSURE N/A 12/26/2016   Procedure: DILATATION & CURETTAGE/HYSTEROSCOPY WITH MYOSURE;  Surgeon: Cris Burnard DEL, MD;  Location: [facility];  Service: Gynecology;  Laterality: N/A;  removal of polyp REP will be here confirmed on 12/23/16   ROTATOR CUFF REPAIR     left   TONSILLECTOMY AND ADENOIDECTOMY     Family History  Problem Relation Age of Onset   Diabetes Paternal Grandmother    Diabetes Maternal Grandmother    Parkinson's disease Father    Heart attack Father    Hodgkin's lymphoma Mother    Cancer Mother    Ovarian cancer Paternal Aunt    Social History   Occupational History   Occupation: runner, broadcasting/film/video  Employer: [employer]  Tobacco Use   Smoking status: Never   Smokeless tobacco: Never  Vaping Use   Vaping status: Never Used  Substance and Sexual Activity   Alcohol use: Yes    Comment: rarely   Drug use: No   Sexual activity: Yes     Partners: Male    Birth control/protection: None    Comment: vasectomy   Tobacco Counseling Counseling given: Not Answered  SDOH Screenings   Food Insecurity: No Food Insecurity (05/10/2024)  Housing: Low Risk (05/10/2024)  Transportation Needs: No Transportation Needs (05/10/2024)  Utilities: Not At Risk (05/10/2024)  Alcohol Screen: Low Risk (03/10/2024)  Depression (PHQ2-9): Low Risk (05/10/2024)  Financial Resource Strain: Low Risk (03/10/2024)  Physical Activity: Sufficiently Active (05/10/2024)  Social Connections: Socially Integrated (03/10/2024)  Stress: No Stress Concern Present (05/10/2024)  Tobacco Use: Low Risk (05/10/2024)  Health Literacy: Adequate Health Literacy (05/10/2024)   See flowsheets for full screening details  Depression Screen PHQ 2 & 9 Depression Scale- Over the past 2 weeks, how often have you been bothered by any of the following problems? Little interest or pleasure in doing things: 0 Feeling down, depressed, or hopeless (PHQ Adolescent also includes...irritable): 0 PHQ-2 Total Score: 0 Trouble falling or staying asleep, or sleeping too much: 0 Feeling tired or having little energy: 0 Poor appetite or overeating (PHQ Adolescent also includes...weight loss): 0 Feeling bad about yourself - or that you are a failure or have let yourself or your family down: 0 Trouble concentrating on things, such as reading the newspaper or watching television (PHQ Adolescent also includes...like school work): 0 Moving or speaking so slowly that other people could have noticed. Or the opposite - being so fidgety or restless that you have been moving around a lot more than usual: 0 Thoughts that you would be better off dead, or of hurting yourself in some way: 0 PHQ-9 Total Score: 0      Goals Addressed             This Visit's Progress    Exercise 150 min/wk Moderate Activity               Objective:    Today's Vitals   05/10/24 1522  BP: 110/60  Pulse:  67  SpO2: 97%  Weight: 132 lb (59.9 kg)  Height: 5' 6 (1.676 m)   Body mass index is 21.31 kg/m.   Physical Exam Vitals and nursing note reviewed.  Constitutional:      Appearance: Normal appearance.  HENT:     Head: Normocephalic and atraumatic.  Eyes:     Conjunctiva/sclera: Conjunctivae normal.  Cardiovascular:     Rate and Rhythm: Normal rate and regular rhythm.  Pulmonary:     Effort: Pulmonary effort is normal.     Breath sounds: Normal breath sounds.  Skin:    General: Skin is warm and dry.  Neurological:     Mental Status: She is alert.  Psychiatric:        Mood and Affect: Mood normal.       Hearing/Vision screen Vision Screening   Right eye Left eye Both eyes  Without correction     With correction 20/20 20/25 20/15    Immunizations and Health Maintenance Health Maintenance  Topic Date Due   Pneumococcal Vaccine: 50+ Years (1 of 1 - PCV) Never done   COVID-19 Vaccine (7 - 2025-26 season) 09/26/2024   Medicare Annual Wellness (AWV)  05/10/2025   Colonoscopy  08/22/2025   Mammogram  12/01/2025   Cervical Cancer Screening (HPV/Pap Cotest)  12/01/2027   DTaP/Tdap/Td (3 - Td or Tdap) 01/21/2028   Influenza Vaccine  Completed   Bone Density Scan  Completed   Hepatitis C Screening  Completed   HIV Screening  Completed   Zoster Vaccines- Shingrix   Completed   Hepatitis B Vaccines 19-59 Average Risk  Aged Out   Meningococcal B Vaccine  Aged Out    EKG: normal EKG, normal sinus rhythm, rate of 64 pbm, no acute changes.       Assessment/Plan:  This is a routine wellness examination for Laurie Cooper.  Patient Care Team: Alvan Dorothyann BIRCH, MD as PCP - General (Family Medicine)  I have personally reviewed and noted the following in the patients chart:   Medical and social history Use of alcohol, tobacco or illicit drugs  Current medications and supplements including opioid prescriptions. Functional ability and status Nutritional status Physical  activity Advanced directives List of other physicians Hospitalizations, surgeries, and ER visits in previous 12 months Vitals Screenings to include cognitive, depression, and falls Referrals and appointments  Orders Placed This Encounter  Procedures   CT CARDIAC SCORING (SELF PAY ONLY)    Standing Status:   Future    Expiration Date:   05/10/2025    Preferred imaging location?:   [facility]   CMP14+EGFR   Lipid panel   CBC   TSH   Hemoglobin A1c   EKG 12-Lead   In addition, I have reviewed and discussed with patient certain preventive protocols, quality metrics, and best practice recommendations. A written personalized care plan for preventive services as well as general preventive health recommendations were provided to patient.   Dorothyann Alvan, MD   05/10/2024   Return in 1 year (on 05/10/2025).  "

## 2024-05-17 ENCOUNTER — Ambulatory Visit (INDEPENDENT_AMBULATORY_CARE_PROVIDER_SITE_OTHER): Payer: Self-pay

## 2024-05-17 DIAGNOSIS — E785 Hyperlipidemia, unspecified: Secondary | ICD-10-CM

## 2024-05-20 ENCOUNTER — Encounter: Payer: Self-pay | Admitting: Family Medicine

## 2024-05-23 ENCOUNTER — Ambulatory Visit: Payer: Self-pay | Admitting: Family Medicine

## 2024-05-23 NOTE — Progress Notes (Signed)
 HI Laurie Cooper, your Cardiac Calcium score is 0, which is great news!!! No plaque burden.  Continue to work on [employer] and exercise.
